# Patient Record
Sex: Male | Born: 1960 | Race: White | Hispanic: No | Marital: Single | Smoking: Former smoker
Health system: Southern US, Community
[De-identification: ages and names within clinical notes are randomized; demographics above are authoritative.]

## PROBLEM LIST (undated history)

## (undated) DIAGNOSIS — E785 Hyperlipidemia, unspecified: Secondary | ICD-10-CM

## (undated) DIAGNOSIS — R011 Cardiac murmur, unspecified: Secondary | ICD-10-CM

## (undated) HISTORY — PX: COLONOSCOPY: SHX174

---

## 2007-10-06 ENCOUNTER — Encounter: Admission: RE | Admit: 2007-10-06 | Discharge: 2007-10-06 | Payer: Self-pay | Admitting: Neurological Surgery

## 2007-10-31 ENCOUNTER — Ambulatory Visit: Payer: Self-pay | Admitting: Neurological Surgery

## 2007-11-21 ENCOUNTER — Ambulatory Visit: Payer: Self-pay | Admitting: Neurological Surgery

## 2008-01-02 ENCOUNTER — Ambulatory Visit: Payer: Self-pay | Admitting: Neurological Surgery

## 2010-03-07 IMAGING — CR CERVICAL SPINE - 2-3 VIEW
1 series · 6 of 6 positions shown · non-contrast
Comparison: none

REASON FOR EXAM: neck pain send films with pt
COMMENTS:

[Series 1: view not recorded · 0.17mm/px · 6 of 6 slices shown]
[im 1/6]
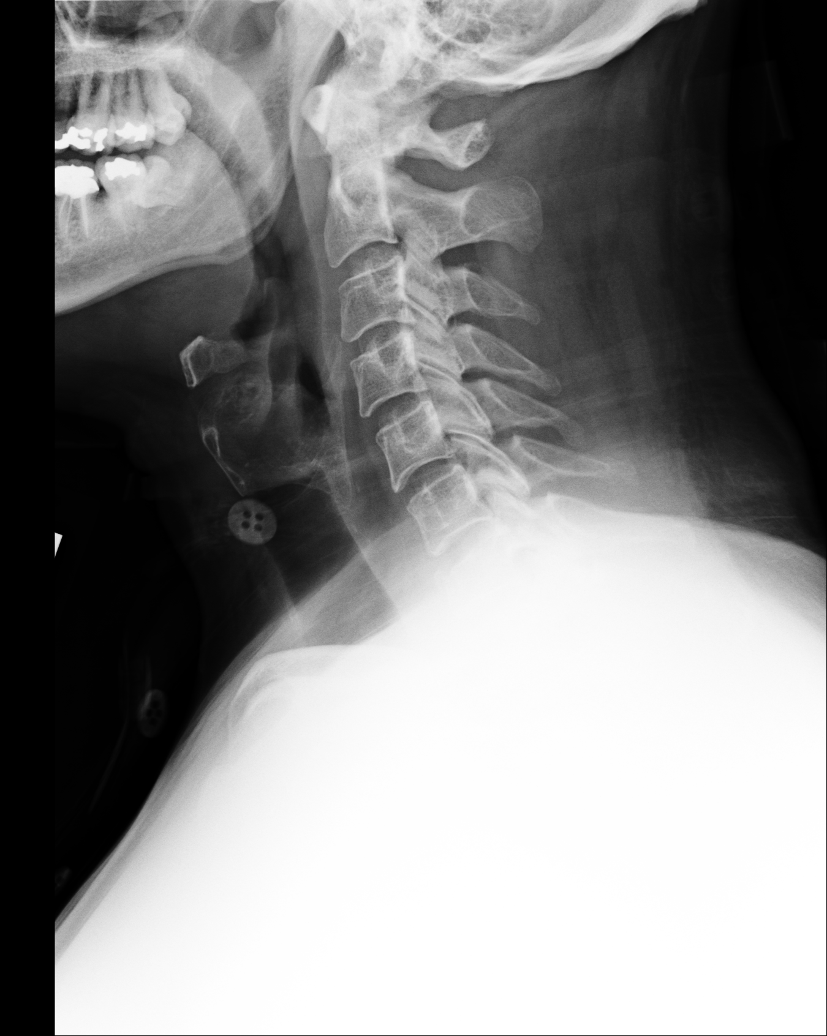
[im 2/6]
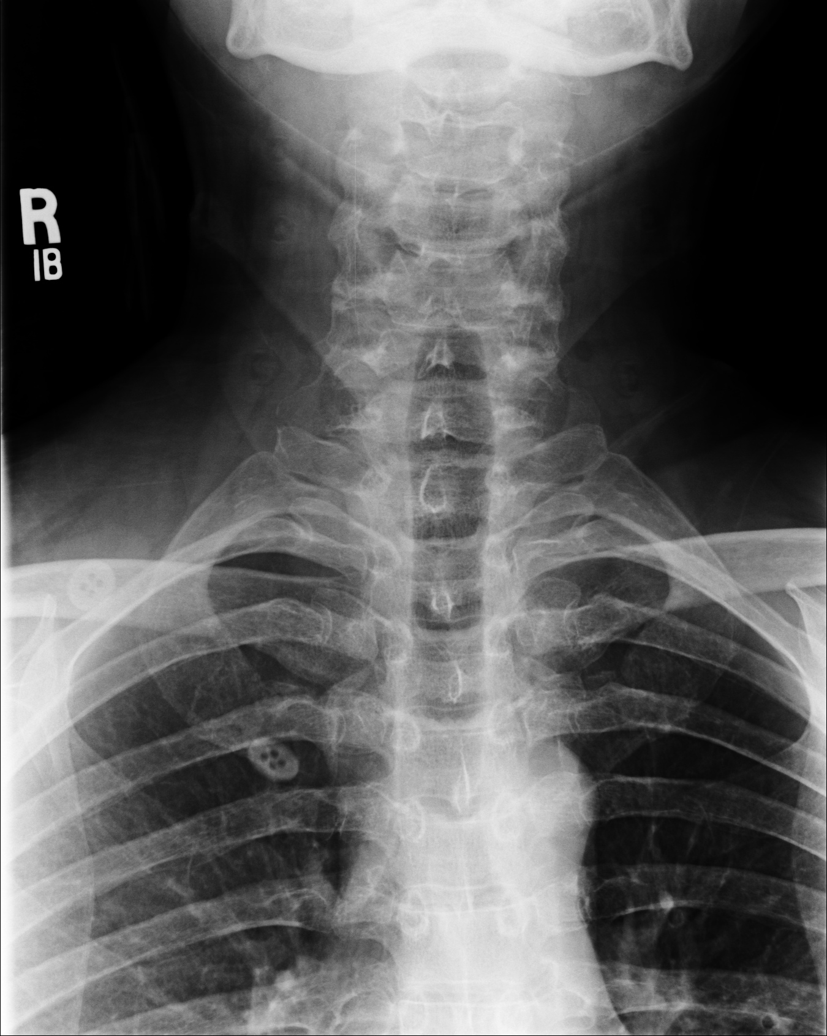
[im 3/6]
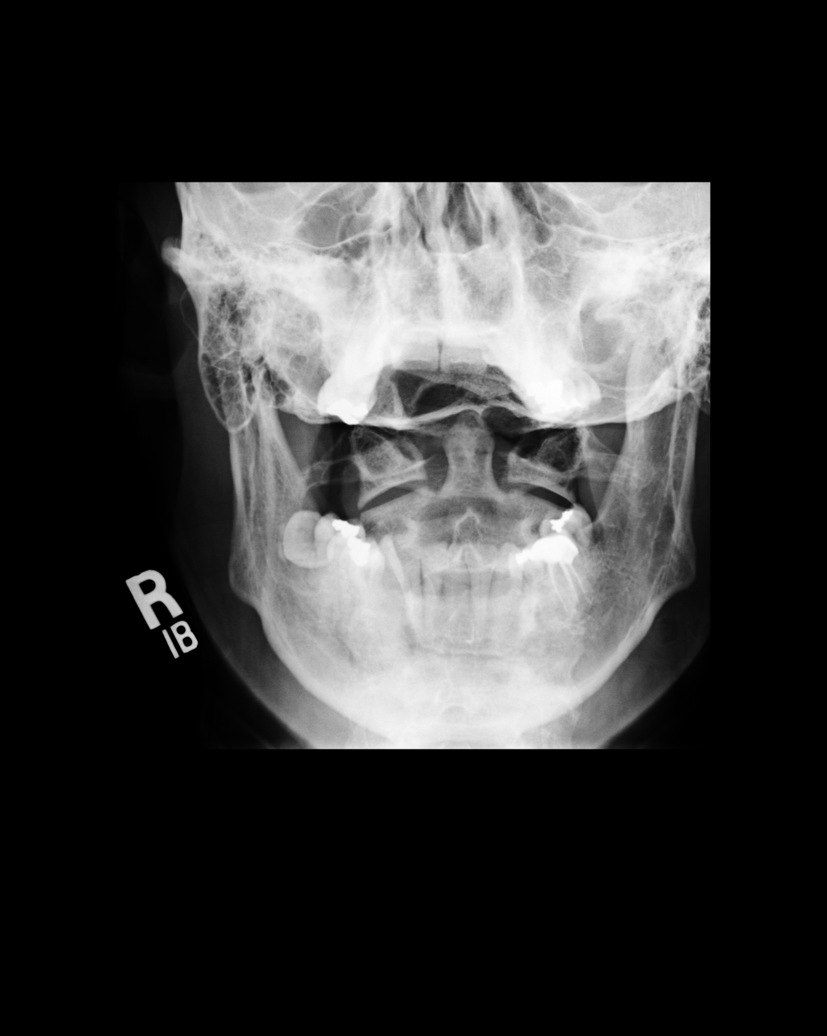
[im 4/6]
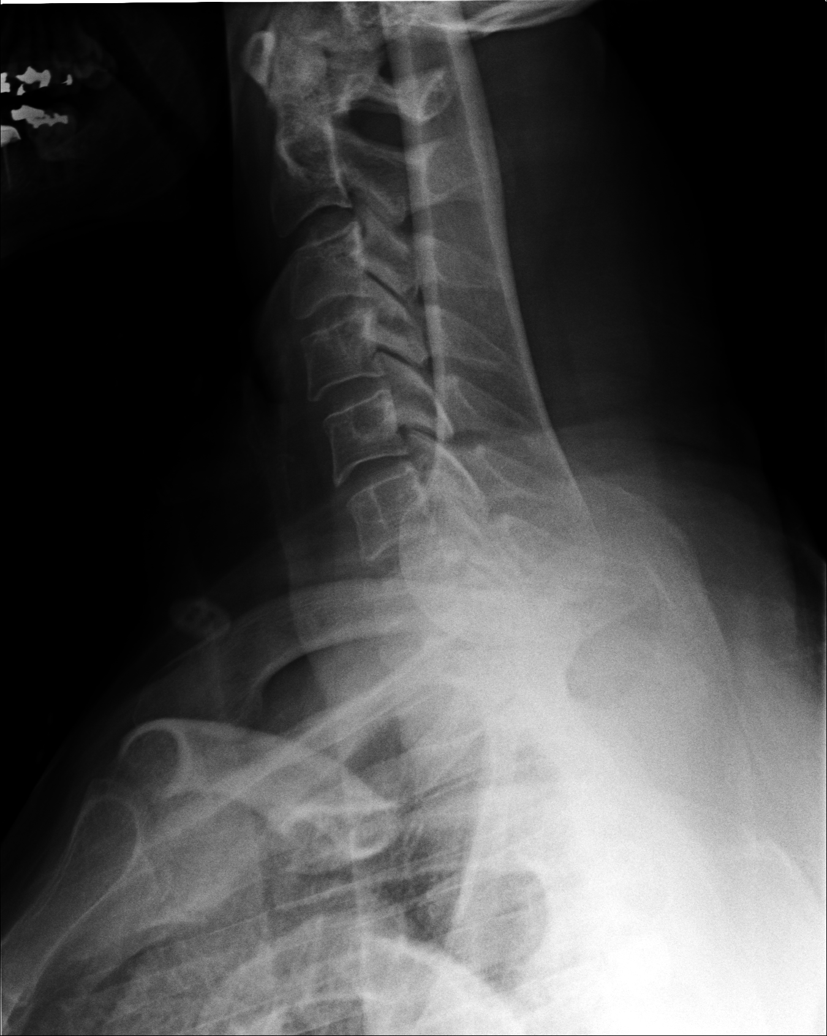
[im 5/6]
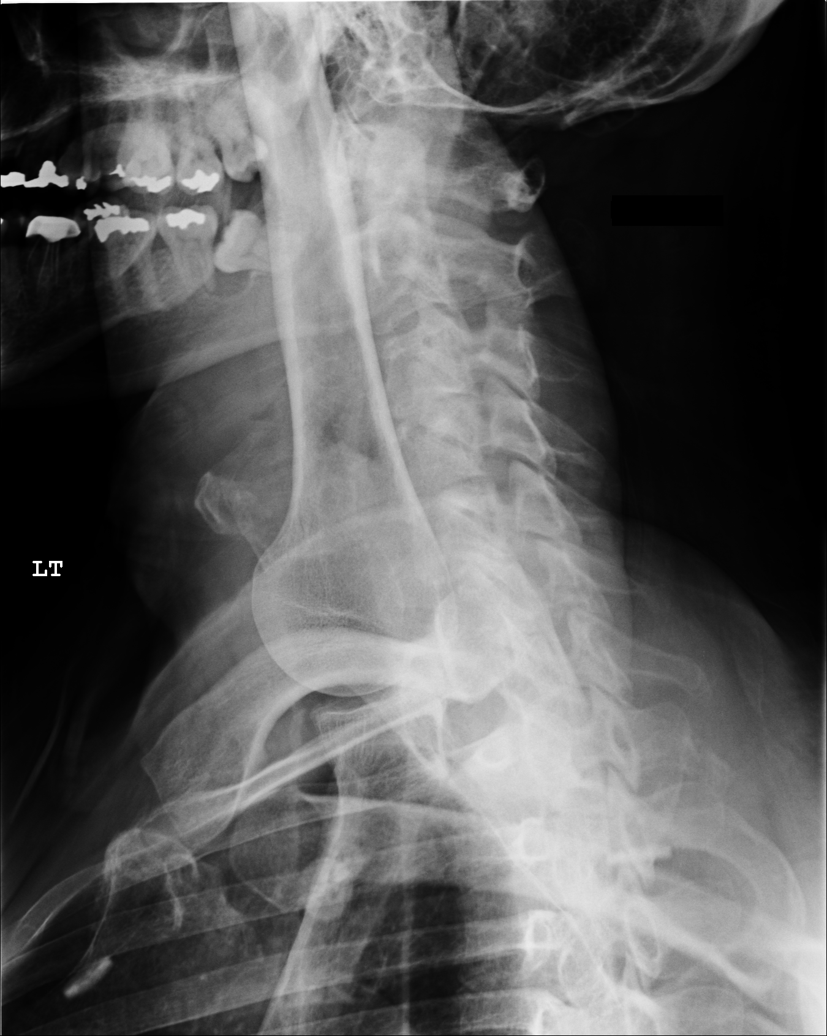
[im 6/6]
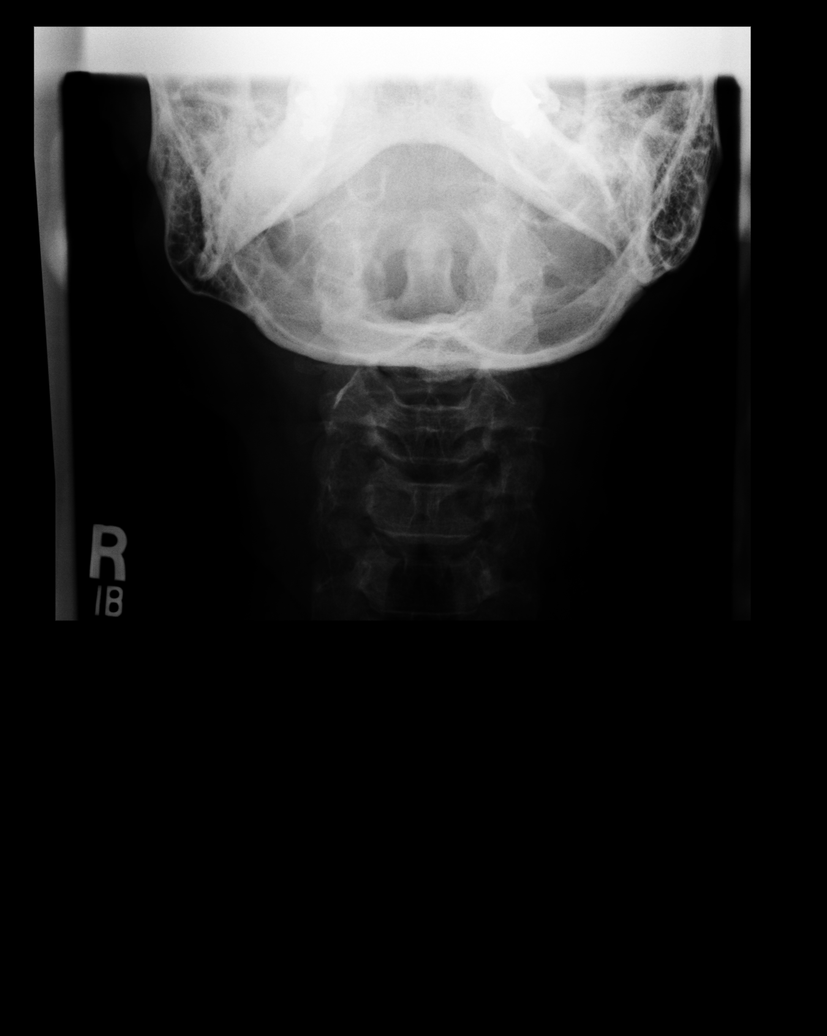

[6 of 6 positions shown; findings below may reference images not displayed]

PROCEDURE:     DXR - DXR C- SPINE AP AND LATERAL  - October 31, 2007 [DATE]

RESULT:     The patient reportedly has as a fracture of C7. AP, lateral and
odontoid views of the cervical spine were obtained. C7 is obscured on the
routine lateral views but is seen on a swimmer's view lateral. There is
deformity of the superior margin of C7 compatible with the clinical history
of C7 fracture. There is approximately 20% loss of vertebral body height
anteriorly. There may be irregularity of the anterior/superior margin of T1
as well but this is not definite.

The vertebral body heights and the intervertebral disc spaces otherwise are
well maintained. The odontoid process is intact. The vertebral body
alignment is normal.
IMPRESSION: 1. There is noted deformity of the anterior aspect of C7 consistent with the
patient's history of prior C7 fracture.
2. Possible irregularity of the anterior/superior margin of T1.
3. Vertebral body alignment remains normal.

## 2019-05-31 ENCOUNTER — Ambulatory Visit: Payer: BC Managed Care – PPO | Attending: Internal Medicine

## 2019-05-31 DIAGNOSIS — Z23 Encounter for immunization: Secondary | ICD-10-CM

## 2019-05-31 NOTE — Progress Notes (Signed)
   Covid-19 Vaccination Clinic  Name:  DAUD CAYER    MRN: [id number] DOB: 1960/11/29  05/31/2019  Mr. Kauth was observed post Covid-19 immunization for 15 minutes without incident. He was provided with Vaccine Information Sheet and instruction to access the V-Safe system.   Mr. Closser was instructed to call 911 with any severe reactions post vaccine: Marland Kitchen Difficulty breathing  . Swelling of face and throat  . A fast heartbeat  . A bad rash all over body  . Dizziness and weakness   Immunizations Administered    Name Date Dose VIS Date Route   Pfizer COVID-19 Vaccine 05/31/2019  5:48 PM 0.3 mL 02/13/2019 Intramuscular   Manufacturer: [employer], [employer]   Lot: [id number]   NDC: [address]-3

## 2019-06-21 ENCOUNTER — Ambulatory Visit: Payer: BC Managed Care – PPO | Attending: Internal Medicine

## 2019-06-21 DIAGNOSIS — Z23 Encounter for immunization: Secondary | ICD-10-CM

## 2019-06-21 NOTE — Progress Notes (Signed)
   Covid-19 Vaccination Clinic  Name:  MAXUM CASSARINO    MRN: [id number] DOB: Mar 07, 1960  06/21/2019  Mr. Okray was observed post Covid-19 immunization for 15 minutes without incident. He was provided with Vaccine Information Sheet and instruction to access the V-Safe system.   Mr. Ohalloran was instructed to call 911 with any severe reactions post vaccine: Marland Kitchen Difficulty breathing  . Swelling of face and throat  . A fast heartbeat  . A bad rash all over body  . Dizziness and weakness   Immunizations Administered    Name Date Dose VIS Date Route   Pfizer COVID-19 Vaccine 06/21/2019  5:41 PM 0.3 mL 04/29/2018 Intramuscular   Manufacturer: [employer], [employer]   Lot: [id number]   NDC: [address]-6

## 2021-03-05 HISTORY — PX: DENTAL SURGERY: SHX609

## 2021-10-24 ENCOUNTER — Encounter: Payer: Self-pay | Admitting: Ophthalmology

## 2021-10-25 ENCOUNTER — Encounter: Payer: Self-pay | Admitting: Ophthalmology

## 2021-10-27 DIAGNOSIS — I10 Essential (primary) hypertension: Secondary | ICD-10-CM | POA: Insufficient documentation

## 2021-10-30 NOTE — Discharge Instructions (Signed)

## 2021-10-31 ENCOUNTER — Ambulatory Visit: Payer: 59 | Admitting: Anesthesiology

## 2021-10-31 ENCOUNTER — Encounter
Admission: RE | Disposition: A | Discharge status: Home / Self Care | Payer: Self-pay | Source: Home / Self Care | Attending: Ophthalmology

## 2021-10-31 ENCOUNTER — Other Ambulatory Visit: Payer: Self-pay

## 2021-10-31 ENCOUNTER — Ambulatory Visit
Admission: RE | Admit: 2021-10-31 | Discharge: 2021-10-31 | Disposition: A | Discharge status: Home / Self Care | Payer: 59 | Attending: Ophthalmology | Admitting: Ophthalmology

## 2021-10-31 ENCOUNTER — Ambulatory Visit (AMBULATORY_SURGERY_CENTER): Payer: 59 | Admitting: Anesthesiology

## 2021-10-31 DIAGNOSIS — I10 Essential (primary) hypertension: Secondary | ICD-10-CM

## 2021-10-31 DIAGNOSIS — E119 Type 2 diabetes mellitus without complications: Secondary | ICD-10-CM

## 2021-10-31 DIAGNOSIS — H2512 Age-related nuclear cataract, left eye: Secondary | ICD-10-CM | POA: Diagnosis not present

## 2021-10-31 DIAGNOSIS — Z87891 Personal history of nicotine dependence: Secondary | ICD-10-CM | POA: Diagnosis not present

## 2021-10-31 HISTORY — PX: CATARACT EXTRACTION W/PHACO: SHX586

## 2021-10-31 HISTORY — DX: Hyperlipidemia, unspecified: E78.5

## 2021-10-31 HISTORY — DX: Cardiac murmur, unspecified: R01.1

## 2021-10-31 SURGERY — PHACOEMULSIFICATION, CATARACT, WITH IOL INSERTION
Anesthesia: Monitor Anesthesia Care | Site: Eye | Laterality: Left

## 2021-10-31 MED ORDER — MOXIFLOXACIN HCL 0.5 % OP SOLN
OPHTHALMIC | Status: DC | PRN
  Administered 2021-10-31: 0.2 mL via OPHTHALMIC

## 2021-10-31 MED ORDER — ARMC OPHTHALMIC DILATING DROPS
1.0000 | OPHTHALMIC | Status: DC | PRN
  Administered 2021-10-31 (×3): 1 via OPHTHALMIC

## 2021-10-31 MED ORDER — MIDAZOLAM HCL 2 MG/2ML IJ SOLN
INTRAMUSCULAR | Status: DC | PRN
  Administered 2021-10-31 (×2): 1 mg via INTRAVENOUS

## 2021-10-31 MED ORDER — TETRACAINE HCL 0.5 % OP SOLN
1.0000 [drp] | OPHTHALMIC | Status: DC | PRN
  Administered 2021-10-31 (×2): 1 [drp] via OPHTHALMIC

## 2021-10-31 MED ORDER — SIGHTPATH DOSE#1 NA CHONDROIT SULF-NA HYALURON 40-17 MG/ML IO SOLN
INTRAOCULAR | Status: DC | PRN
  Administered 2021-10-31: 1 mL via INTRAOCULAR

## 2021-10-31 MED ORDER — BRIMONIDINE TARTRATE-TIMOLOL 0.2-0.5 % OP SOLN
OPHTHALMIC | Status: DC | PRN
  Administered 2021-10-31: 1 [drp] via OPHTHALMIC

## 2021-10-31 MED ORDER — SIGHTPATH DOSE#1 BSS IO SOLN
INTRAOCULAR | Status: DC | PRN
  Administered 2021-10-31: 15 mL via INTRAOCULAR

## 2021-10-31 MED ORDER — FENTANYL CITRATE (PF) 100 MCG/2ML IJ SOLN
INTRAMUSCULAR | Status: DC | PRN
  Administered 2021-10-31: 100 ug via INTRAVENOUS

## 2021-10-31 MED ORDER — LACTATED RINGERS IV SOLN: INTRAVENOUS | Status: DC

## 2021-10-31 MED ORDER — FENTANYL CITRATE PF 50 MCG/ML IJ SOSY: 25.0000 ug | PREFILLED_SYRINGE | INTRAMUSCULAR | Status: DC | PRN

## 2021-10-31 MED ORDER — SIGHTPATH DOSE#1 BSS IO SOLN
INTRAOCULAR | Status: DC | PRN
  Administered 2021-10-31: 66 mL via OPHTHALMIC

## 2021-10-31 MED ORDER — SIGHTPATH DOSE#1 BSS IO SOLN
INTRAOCULAR | Status: DC | PRN
  Administered 2021-10-31: 2 mL

## 2021-10-31 MED ORDER — ONDANSETRON HCL 4 MG/2ML IJ SOLN: 4.0000 mg | Freq: Once | INTRAMUSCULAR | Status: DC | PRN

## 2021-10-31 SURGICAL SUPPLY — 12 items
CANNULA ANT/CHMB 27G (MISCELLANEOUS) IMPLANT
CANNULA ANT/CHMB 27GA (MISCELLANEOUS) IMPLANT
CATARACT SUITE SIGHTPATH (MISCELLANEOUS) ×1 IMPLANT
FEE CATARACT SUITE SIGHTPATH (MISCELLANEOUS) ×1 IMPLANT
GLOVE SURG ENC TEXT LTX SZ8 (GLOVE) ×1 IMPLANT
GLOVE SURG TRIUMPH 8.0 PF LTX (GLOVE) ×1 IMPLANT
LENS IOL TECNIS EYHANCE 18.5 (Intraocular Lens) IMPLANT
NDL FILTER BLUNT 18X1 1/2 (NEEDLE) ×1 IMPLANT
NEEDLE FILTER BLUNT 18X 1/2SAF (NEEDLE) ×1
NEEDLE FILTER BLUNT 18X1 1/2 (NEEDLE) ×1 IMPLANT
SYR 3ML LL SCALE MARK (SYRINGE) ×1 IMPLANT
WATER STERILE IRR 250ML POUR (IV SOLUTION) ×1 IMPLANT

## 2021-10-31 NOTE — H&P (Signed)
[facility]   Primary Care Physician:  Marisue Ivan, MD Ophthalmologist: Dr. Druscilla Brownie  Pre-Procedure History & Physical: HPI:  Louis Barron is a 61 y.o. male here for cataract surgery.   Past Medical History:  Diagnosis Date   Heart murmur    as a child   Hyperlipidemia     Past Surgical History:  Procedure Laterality Date   COLONOSCOPY     DENTAL SURGERY  2023    Prior to Admission medications   Medication Sig Start Date End Date Taking? Authorizing Provider  chlorthalidone (HYGROTON) 25 MG tablet Take 25 mg by mouth daily.   Yes [provider]  simvastatin (ZOCOR) 40 MG tablet Take 40 mg by mouth daily.   Yes [provider]  venlafaxine XR (EFFEXOR-XR) 75 MG 24 hr capsule Take 150 mg by mouth at bedtime.   Yes [provider]    Allergies as of 09/19/2021   (Not on File)    History reviewed. No pertinent family history.  Social History   Socioeconomic History   Marital status: Single    Spouse name: Not on file   Number of children: Not on file   Years of education: Not on file   Highest education level: Not on file  Occupational History   Not on file  Tobacco Use   Smoking status: Former    Types: Cigarettes    Quit date: 45    Years since quitting: 40.6   Smokeless tobacco: Never   Tobacco comments:    Smoked while in the [employer] Use: Never used  Substance and Sexual Activity   Alcohol use: Yes    Alcohol/week: 12.0 standard drinks of alcohol    Types: 12 Cans of beer per week   Drug use: Not on file   Sexual activity: Not on file  Other Topics Concern   Not on file  Social History Narrative   Not on file   Social Determinants of Health   Financial Resource Strain: Not on file  Food Insecurity: Not on file  Transportation Needs: Not on file  Physical Activity: Not on file  Stress: Not on file  Social Connections: Not on file  Intimate Partner Violence: Not on file    Review  of Systems: See HPI, otherwise negative ROS  Physical Exam: BP (!) 144/94   Pulse 84   Temp (!) 97.5 F (36.4 C) (Temporal)   Resp 18   Ht 5\' 9"  (1.753 m)   Wt 109.8 kg   SpO2 95%   BMI 35.74 kg/m  General:   Alert, cooperative in NAD Head:  Normocephalic and atraumatic. Respiratory:  Normal work of breathing. Cardiovascular:  RRR  Impression/Plan: Louis Barron is here for cataract surgery.  Risks, benefits, limitations, and alternatives regarding cataract surgery have been reviewed with the patient.  Questions have been answered.  All parties agreeable.   Victorino December, MD  10/31/2021, 11:26 AM

## 2021-10-31 NOTE — Anesthesia Preprocedure Evaluation (Signed)
Anesthesia Evaluation  Patient identified by MRN, date of birth, ID band Patient awake    Reviewed: Allergy & Precautions, H&P , NPO status , Patient's Chart, lab work & pertinent test results, reviewed documented beta blocker date and time   Airway Mallampati: II  TM Distance: >3 FB Neck ROM: full    Dental no notable dental hx. (+) Teeth Intact   Pulmonary neg pulmonary ROS, former smoker,    Pulmonary exam normal breath sounds clear to auscultation       Cardiovascular Exercise Tolerance: Good hypertension, On Medications + Valvular Problems/Murmurs  Rhythm:regular Rate:Normal     Neuro/Psych negative neurological ROS  negative psych ROS   GI/Hepatic negative GI ROS, Neg liver ROS,   Endo/Other  negative endocrine ROSdiabetes, Well Controlled  Renal/GU      Musculoskeletal   Abdominal   Peds  Hematology negative hematology ROS (+)   Anesthesia Other Findings   Reproductive/Obstetrics negative OB ROS                             Anesthesia Physical Anesthesia Plan  ASA: 2  Anesthesia Plan: MAC   Post-op Pain Management:    Induction:   PONV Risk Score and Plan: 1  Airway Management Planned:   Additional Equipment:   Intra-op Plan:   Post-operative Plan:   Informed Consent: I have reviewed the patients History and Physical, chart, labs and discussed the procedure including the risks, benefits and alternatives for the proposed anesthesia with the patient or authorized representative who has indicated his/her understanding and acceptance.       Plan Discussed with: CRNA  Anesthesia Plan Comments:         Anesthesia Quick Evaluation

## 2021-10-31 NOTE — Anesthesia Postprocedure Evaluation (Signed)
Anesthesia Post Note  Patient: Louis Barron  Procedure(s) Performed: CATARACT EXTRACTION PHACO AND INTRAOCULAR LENS PLACEMENT (IOC) LEFT 7.60 00:44.7 (Left: Eye)     Patient location during evaluation: PACU Anesthesia Type: MAC Level of consciousness: awake and alert Pain management: pain level controlled Vital Signs Assessment: post-procedure vital signs reviewed and stable Respiratory status: spontaneous breathing, nonlabored ventilation, respiratory function stable and patient connected to nasal cannula oxygen Cardiovascular status: stable and blood pressure returned to baseline Postop Assessment: no apparent nausea or vomiting Anesthetic complications: no   There were no known notable events for this encounter.  [name]

## 2021-10-31 NOTE — Transfer of Care (Signed)
Immediate Anesthesia Transfer of Care Note  Patient: Louis Barron  Procedure(s) Performed: CATARACT EXTRACTION PHACO AND INTRAOCULAR LENS PLACEMENT (IOC) LEFT 7.60 00:44.7 (Left: Eye)  Patient Location: PACU  Anesthesia Type: MAC  Level of Consciousness: awake, alert  and patient cooperative  Airway and Oxygen Therapy: Patient Spontanous Breathing and Patient connected to supplemental oxygen  Post-op Assessment: Post-op Vital signs reviewed, Patient's Cardiovascular Status Stable, Respiratory Function Stable, Patent Airway and No signs of Nausea or vomiting  Post-op Vital Signs: Reviewed and stable  Complications: There were no known notable events for this encounter.

## 2021-10-31 NOTE — Op Note (Signed)
PREOPERATIVE DIAGNOSIS:  Nuclear sclerotic cataract of the left eye.   POSTOPERATIVE DIAGNOSIS:  Nuclear sclerotic cataract of the left eye.   OPERATIVE PROCEDURE:ORPROCALL@   SURGEON:  Galen Manila, MD.   ANESTHESIA:  Anesthesiologist: Yevette Edwards, MD CRNA: Sharyn Dross, CRNA  1.      Managed anesthesia care. 2.     0.45ml of Shugarcaine was instilled following the paracentesis   COMPLICATIONS:  None.   TECHNIQUE:   Stop and chop   DESCRIPTION OF PROCEDURE:  The patient was examined and consented in the preoperative holding area where the aforementioned topical anesthesia was applied to the left eye and then brought back to the Operating Room where the left eye was prepped and draped in the usual sterile ophthalmic fashion and a lid speculum was placed. A paracentesis was created with the side port blade and the anterior chamber was filled with viscoelastic. A near clear corneal incision was performed with the steel keratome. A continuous curvilinear capsulorrhexis was performed with a cystotome followed by the capsulorrhexis forceps. Hydrodissection and hydrodelineation were carried out with BSS on a blunt cannula. The lens was removed in a stop and chop  technique and the remaining cortical material was removed with the irrigation-aspiration handpiece. The capsular bag was inflated with viscoelastic and the Technis [id number] lens was placed in the capsular bag without complication. The remaining viscoelastic was removed from the eye with the irrigation-aspiration handpiece. The wounds were hydrated. The anterior chamber was flushed with BSS and the eye was inflated to physiologic pressure. 0.84ml Vigamox was placed in the anterior chamber. The wounds were found to be water tight. The eye was dressed with Combigan. The patient was given protective glasses to wear throughout the day and a shield with which to sleep tonight. The patient was also given drops with which to begin a drop regimen  today and will follow-up with me in one day. Implant Name Type Inv. Item Serial No. Manufacturer Lot No. LRB No. Used Action  LENS IOL TECNIS EYHANCE 18.5 - [id number] Intraocular Lens LENS IOL TECNIS EYHANCE 18.5 [id number] [employer]  Left 1 Implanted    Procedure(s) with comments: CATARACT EXTRACTION PHACO AND INTRAOCULAR LENS PLACEMENT (IOC) LEFT 7.60 00:44.7 (Left) - Requests early  Electronically signed: Galen Manila 10/31/2021 11:54 AM

## 2021-11-01 ENCOUNTER — Encounter: Payer: Self-pay | Admitting: Ophthalmology

## 2022-01-26 DIAGNOSIS — R0602 Shortness of breath: Secondary | ICD-10-CM | POA: Diagnosis present

## 2022-01-26 DIAGNOSIS — Z1152 Encounter for screening for COVID-19: Secondary | ICD-10-CM | POA: Diagnosis not present

## 2022-01-26 DIAGNOSIS — R062 Wheezing: Secondary | ICD-10-CM | POA: Diagnosis not present

## 2022-01-26 DIAGNOSIS — R059 Cough, unspecified: Secondary | ICD-10-CM | POA: Diagnosis not present

## 2022-01-26 DIAGNOSIS — E871 Hypo-osmolality and hyponatremia: Secondary | ICD-10-CM | POA: Diagnosis not present

## 2022-01-26 DIAGNOSIS — Z87891 Personal history of nicotine dependence: Secondary | ICD-10-CM | POA: Diagnosis not present

## 2022-01-26 NOTE — ED Triage Notes (Signed)
First Nurse Note: Pt reports seen at [facility] in [place] for c/o sob for one month. They did CXR and dx w/acute cough. Pt reports they advised for him to come to the ER if not better. Pt reports chest rightness and wheezing.

## 2022-01-27 ENCOUNTER — Other Ambulatory Visit: Payer: Self-pay

## 2022-01-27 ENCOUNTER — Emergency Department: Payer: 59

## 2022-01-27 ENCOUNTER — Emergency Department
Admission: EM | Admit: 2022-01-27 | Discharge: 2022-01-27 | Disposition: A | Discharge status: Home / Self Care | Payer: 59 | Attending: Emergency Medicine | Admitting: Emergency Medicine

## 2022-01-27 DIAGNOSIS — R062 Wheezing: Secondary | ICD-10-CM

## 2022-01-27 DIAGNOSIS — E871 Hypo-osmolality and hyponatremia: Secondary | ICD-10-CM

## 2022-01-27 DIAGNOSIS — R051 Acute cough: Secondary | ICD-10-CM

## 2022-01-27 LAB — CBC
HCT: 43.8 % (ref 39.0–52.0)
Hemoglobin: 15.6 g/dL (ref 13.0–17.0)
MCH: 30.3 pg (ref 26.0–34.0)
MCHC: 35.6 g/dL (ref 30.0–36.0)
MCV: 85 fL (ref 80.0–100.0)
Platelets: 343 10*3/uL (ref 150–400)
RBC: 5.15 MIL/uL (ref 4.22–5.81)
RDW: 12.3 % (ref 11.5–15.5)
WBC: 11.1 10*3/uL — ABNORMAL HIGH (ref 4.0–10.5)
nRBC: 0 % (ref 0.0–0.2)

## 2022-01-27 LAB — BASIC METABOLIC PANEL
Anion gap: 11 (ref 5–15)
Anion gap: 9 (ref 5–15)
BUN: 15 mg/dL (ref 8–23)
BUN: 17 mg/dL (ref 8–23)
CO2: 23 mmol/L (ref 22–32)
CO2: 24 mmol/L (ref 22–32)
Calcium: 8.3 mg/dL — ABNORMAL LOW (ref 8.9–10.3)
Calcium: 8.4 mg/dL — ABNORMAL LOW (ref 8.9–10.3)
Chloride: 91 mmol/L — ABNORMAL LOW (ref 98–111)
Chloride: 94 mmol/L — ABNORMAL LOW (ref 98–111)
Creatinine, Ser: 0.77 mg/dL (ref 0.61–1.24)
Creatinine, Ser: 0.92 mg/dL (ref 0.61–1.24)
GFR, Estimated: >60 mL/min (ref >60)
GFR, Estimated: >60 mL/min (ref >60)
Glucose, Bld: 108 mg/dL — ABNORMAL HIGH (ref 70–99)
Glucose, Bld: 109 mg/dL — ABNORMAL HIGH (ref 70–99)
Potassium: 3.3 mmol/L — ABNORMAL LOW (ref 3.5–5.1)
Potassium: 3.5 mmol/L (ref 3.5–5.1)
Sodium: 123 mmol/L — ABNORMAL LOW (ref 135–145)
Sodium: 129 mmol/L — ABNORMAL LOW (ref 135–145)

## 2022-01-27 LAB — RESP PANEL BY RT-PCR (FLU A&B, COVID) ARPGX2
Influenza A by PCR: NEGATIVE
Influenza B by PCR: NEGATIVE
SARS Coronavirus 2 by RT PCR: NEGATIVE

## 2022-01-27 LAB — TROPONIN I (HIGH SENSITIVITY)
Troponin I (High Sensitivity): 5 ng/L (ref <18)
Troponin I (High Sensitivity): 6 ng/L (ref <18)

## 2022-01-27 MED ORDER — IOHEXOL 350 MG/ML SOLN
80.0000 mL | Freq: Once | INTRAVENOUS | Status: AC | PRN
  Administered 2022-01-27: 80 mL via INTRAVENOUS

## 2022-01-27 MED ORDER — AZITHROMYCIN 250 MG PO TABS: ORAL_TABLET | ORAL | 0 refills | Status: DC

## 2022-01-27 MED ORDER — OMEPRAZOLE MAGNESIUM 20 MG PO TBEC: 20.0000 mg | DELAYED_RELEASE_TABLET | Freq: Every day | ORAL | 1 refills | Status: AC

## 2022-01-27 MED ORDER — IPRATROPIUM-ALBUTEROL 0.5-2.5 (3) MG/3ML IN SOLN
3.0000 mL | Freq: Once | RESPIRATORY_TRACT | Status: AC
  Administered 2022-01-27: 3 mL via RESPIRATORY_TRACT
  Filled 2022-01-27: qty 3

## 2022-01-27 MED ORDER — SODIUM CHLORIDE 0.9 % IV BOLUS
1000.0000 mL | Freq: Once | INTRAVENOUS | Status: AC
  Administered 2022-01-27: 1000 mL via INTRAVENOUS

## 2022-01-27 NOTE — ED Notes (Signed)
Nt Hydrographic surveyor) recommend pt to lay in bed. Pt refused and stated due to abd pain he's comfortable sitting in wheelchair.

## 2022-01-27 NOTE — ED Notes (Signed)
PT REQUEST TO BE RE-EVAULATED, PT REPORTS INCREASE SOB AND CP, PT CRYING OUT IN PAIN IN WR LOBBY, PT TAKEN BACK TO TRIAGE FOR VITALS RECHECK. CHARGE RN NOTIFIED

## 2022-01-27 NOTE — ED Provider Notes (Signed)
[facility] Provider Note    Event Date/Time   First MD Initiated Contact with Patient 01/27/22 [phone]     (approximate)   History   Shortness of Breath   HPI  Louis Barron is a 62 y.o. male who presents for evaluation of shortness of breath and some chest pain.  He said that he has been struggling with shortness of breath for about 6 weeks.  He feels tight and wheezy at times.  He went to an urgent care a while ago and had a chest x-ray that was normal and he was described just with a cough.  He said that since then he has had worsening chest tightness and some pain that radiates through his ribs on both sides.  He is not certain if this is a new problem, or if it is because he has been coughing for so long.  He said it is making him scared that something worse is happening and he has a history of anxiety.  He has no known cardiac history.  He has no diagnosis of sleep apnea but said that sometimes when he wakes up at night he is gasping for breath.  He has only a very distant smoking history and no diagnosis of COPD or other lung disease.  No recent fevers.  No nausea, vomiting, nor abdominal pain.  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     Physical Exam   Triage Vital Signs: ED Triage Vitals  Enc Vitals Group     BP 01/27/22 0010 (!) 170/105     Pulse Rate 01/27/22 0010 81     Resp 01/27/22 0010 15     Temp 01/27/22 0010 97.8 F (36.6 C)     Temp Source 01/27/22 0010 Oral     SpO2 01/27/22 0010 94 %     Weight 01/27/22 0011 108.9 kg (240 lb)     Height 01/27/22 0011 1.753 m (5\' 9" )     Head Circumference --      Peak Flow --      Pain Score 01/27/22 0011 9     Pain Loc --      Pain Edu? --      Excl. in GC? --     Most recent vital signs: Vitals:   01/27/22 0500 01/27/22 0630  BP: (!) 172/100 (!) 159/97  Pulse: 72 67  Resp: (!) 23 (!) 23  Temp:    SpO2: 92%  93%     General: Awake, alert, oriented. CV:  Good peripheral perfusion.  Regular rate and rhythm, normal heart sounds. Resp:  The patient has some tachypnea but no audible wheezing.  Clear breath sounds throughout both sides although this seems somewhat diminished compared to what I would anticipate. Abd:  No distention.  No tenderness to palpation. Other:  Patient is obviously quite anxious but understandable under the circumstances.   ED Results / Procedures / Treatments   Labs (all labs ordered are listed, but only abnormal results are displayed) Labs Reviewed  BASIC METABOLIC PANEL - Abnormal; Notable for the following components:      Result Value   Sodium 123 (*)    Chloride 91 (*)    Glucose, Bld 108 (*)    Calcium 8.4 (*)    All other components within normal limits  CBC - Abnormal; Notable for the following components:  WBC 11.1 (*)    All other components within normal limits  RESP PANEL BY RT-PCR (FLU A&B, COVID) ARPGX2  BASIC METABOLIC PANEL  TROPONIN I (HIGH SENSITIVITY)  TROPONIN I (HIGH SENSITIVITY)     EKG  ED ECG REPORT I, Hinda Kehr, the attending physician, personally viewed and interpreted this ECG.  Date: 01/27/2022 EKG Time: 00: 12 Rate: 81 Rhythm: normal sinus rhythm QRS Axis: normal Intervals: normal ST/T Wave abnormalities: normal Narrative Interpretation: no evidence of acute ischemia    RADIOLOGY I viewed and interpreted the patient's two-view chest x-ray.  I see no evidence of pneumonia, pneumothorax, nor other acute abnormality.  I also read the radiologist's report, which confirmed no acute findings.  I also viewed and interpreted the patient's CTA chest.  See hospital course for details, some scattered groundglass opacities (infectious or inflammatory), no PE.    PROCEDURES:  Critical Care performed: No  .1-3 Lead EKG Interpretation  Performed by: Hinda Kehr, MD Authorized by: Hinda Kehr, MD     Interpretation:  normal     ECG rate:  68   ECG rate assessment: normal     Rhythm: sinus rhythm     Ectopy: none     Conduction: normal      MEDICATIONS ORDERED IN ED: Medications  ipratropium-albuterol (DUONEB) 0.5-2.5 (3) MG/3ML nebulizer solution 3 mL (3 mLs Nebulization Given 01/27/22 0025)  sodium chloride 0.9 % bolus 1,000 mL (1,000 mLs Intravenous New Bag/Given 01/27/22 0538)  iohexol (OMNIPAQUE) 350 MG/ML injection 80 mL (80 mLs Intravenous Contrast Given 01/27/22 0609)     IMPRESSION / MDM / ASSESSMENT AND PLAN / ED COURSE  I reviewed the triage vital signs and the nursing notes.                              Differential diagnosis includes, but is not limited to, subacute/chronic bronchitis, new diagnosis of lung disease such as COPD, new onset CHF, sleep apnea, ACS, PE, pneumothorax.  Patient's presentation is most consistent with acute presentation with potential threat to life or bodily function.  Vital signs are generally reassuring although patient does have some tachypnea.  I think that anxiety definitely plays a role in his symptoms and I have verified in his medical record he has prior diagnosis of anxiety.  However I think that it is probably exacerbating what he is feeling and that he is having true dyspnea.  Based on his body habitus and the description of waking up gasping for breath, I suspect that sleep apnea also plays a role but it is likely not the primary cause.  Labs/studies ordered: EKG, cardiac monitoring, two-view chest x-ray, BMP, respiratory viral panel, high-sensitivity troponin x2, CBC.  The patient is on the cardiac monitor to evaluate for evidence of arrhythmia and/or significant heart rate changes.  Although the patient is relatively low risk for PE, that would be the major potentially life-threatening issue that needs to be ruled out.  Given his worsening shortness of breath over time, generally clear lung sounds, and now occasional chest pain, I ordered a CTA  chest to rule out pulmonary embolism as well as to get a better look at his lung parenchyma for possible interstitial lung disease or pneumonia.  His labs are all generally reassuring except that he has a somewhat surprisingly low sodium at 123.  This could be lab error because he does not appear to be profoundly dehydrated and he does not  seem to be having symptoms of hyponatremia such as generalized weakness.  It is worth rechecking a metabolic panel after completion of a 1 L normal saline fluid bolus that I ordered.  Disposition will be based on the results of his imaging and overall respiratory status.  He had one episode where it was documented at 89% on room air but it is unclear why this happened once and has not been persistent.  We will likely ambulate the patient if no acute abnormalities are found on CTA to see if he desats with ambulation.   Clinical Course as of 01/27/22 0804  Sat Jan 27, 2022  0802 CT Angio Chest PE W/Cm &/Or Wo Cm I viewed and interpreted the patient's CTA chest and could see no evidence of pulmonary embolism.  There are a few scattered areas in the lungs that the radiologist described as groundglass opacities that could be infectious or inflammatory.  I went over all the results with the patient.  He remains very anxious about his symptoms but I reminded him, as told him by the urgent care, that acid reflux may be contributing substantially to his symptoms.  He is not taking a PPI so I encouraged him to do so.  I also talked him about raising the head of his bed up for sleeping on pillows to try to decrease the probability of aspiration.  I am writing him a course of azithromycin and Prilosec and encouraged him to follow-up with his primary care doctor.  Given his initial very low sodium level, now that he has had fluid resuscitation in the form of 1 L normal saline IV bolus, I am ordering a repeat BMP.  I transferred ED care to Dr. Jacelyn Grip, who will follow-up on the  sodium level, and if improved, will discharge the patient as per my instructions and follow-up recommendations.  The patient understands and agrees with the plan. [CF]    Clinical Course User Index [CF] Hinda Kehr, MD     FINAL CLINICAL IMPRESSION(S) / ED DIAGNOSES   Final diagnoses:  Acute cough  Hyponatremia  Wheezing     Rx / DC Orders   ED Discharge Orders          Ordered    omeprazole (PRILOSEC OTC) 20 MG tablet  Daily        01/27/22 0759    azithromycin (ZITHROMAX) 250 MG tablet        01/27/22 0759             Note:  This document was prepared using Dragon voice recognition software and may include unintentional dictation errors.   Hinda Kehr, MD 01/27/22 [phone]

## 2022-01-27 NOTE — Discharge Instructions (Signed)
As we discussed, your evaluation was generally reassuring.  You have a few areas on the CT scan of your chest that may represent inflammation or a small amount of infection, but is nothing dangerous that requires you to stay in the hospital.  We recommend you continue taking all of your regular medications, including the medications you were prescribed at the urgent care, as well as the new prescription for azithromycin which you will be able to finish in 5 days.  As we (in the urgent care) also discussed with you, it is very likely that acid reflux is playing a role in the irritation of your throat and lungs that is causing you to cough.  We recommend you try sleeping on a couple of pillows to stay elevated and take an over-the-counter proton pump inhibitor (PPI) such as Prilosec.  It is available over-the-counter but we also wrote you a prescription to help you find it at the store.  Please call the office of Dr. Burnadette Pop and schedule the next available follow-up appointment.  Of note, your sodium level was a bit low today, and that could be due to to a variety of reasons, including if you have been drinking a lot of water recently.  We recommend that you drink what is a normal amount of water for you and do not drink water to excess.  When you follow-up with your primary care provider, you may get some repeat lab work at that time to make sure your level is normal.  Return to the emergency department if you develop new or worsening symptoms that concern you.

## 2022-01-27 NOTE — ED Triage Notes (Addendum)
Pt comes from home via POV c/o sob and wheezing. Pt states he went to UC and they gave him medicine but albuterol inhaler has not been working. Now Reporting chest tightness and rib pain and SOB/wheezing getting worse. NAD at this time.

## 2022-01-27 NOTE — ED Notes (Signed)
Pt to CT

## 2022-09-11 ENCOUNTER — Other Ambulatory Visit: Payer: Self-pay | Admitting: Family Medicine

## 2022-09-11 DIAGNOSIS — Z9189 Other specified personal risk factors, not elsewhere classified: Secondary | ICD-10-CM

## 2022-09-28 ENCOUNTER — Ambulatory Visit
Admission: RE | Admit: 2022-09-28 | Discharge: 2022-09-28 | Disposition: A | Discharge status: Home / Self Care | Payer: 59 | Source: Ambulatory Visit | Attending: Family Medicine | Admitting: Family Medicine

## 2022-09-28 DIAGNOSIS — Z9189 Other specified personal risk factors, not elsewhere classified: Secondary | ICD-10-CM | POA: Insufficient documentation

## 2023-03-15 ENCOUNTER — Other Ambulatory Visit: Payer: Self-pay | Admitting: Family Medicine

## 2023-03-15 DIAGNOSIS — N644 Mastodynia: Secondary | ICD-10-CM

## 2023-03-22 ENCOUNTER — Ambulatory Visit
Admission: RE | Admit: 2023-03-22 | Discharge: 2023-03-22 | Disposition: A | Discharge status: Home / Self Care | Payer: 59 | Source: Ambulatory Visit | Attending: Family Medicine | Admitting: Family Medicine

## 2023-03-22 DIAGNOSIS — N644 Mastodynia: Secondary | ICD-10-CM

## 2023-04-22 ENCOUNTER — Other Ambulatory Visit: Payer: Self-pay

## 2023-04-22 ENCOUNTER — Emergency Department: Payer: 59

## 2023-04-22 ENCOUNTER — Observation Stay
Admission: EM | Admit: 2023-04-22 | Discharge: 2023-04-25 | Disposition: A | Discharge status: Home / Self Care | Payer: 59 | Attending: Internal Medicine | Admitting: Internal Medicine

## 2023-04-22 DIAGNOSIS — Z6838 Body mass index (BMI) 38.0-38.9, adult: Secondary | ICD-10-CM | POA: Insufficient documentation

## 2023-04-22 DIAGNOSIS — J42 Unspecified chronic bronchitis: Secondary | ICD-10-CM | POA: Diagnosis not present

## 2023-04-22 DIAGNOSIS — R7989 Other specified abnormal findings of blood chemistry: Secondary | ICD-10-CM | POA: Diagnosis not present

## 2023-04-22 DIAGNOSIS — Z1152 Encounter for screening for COVID-19: Secondary | ICD-10-CM | POA: Diagnosis not present

## 2023-04-22 DIAGNOSIS — Z87891 Personal history of nicotine dependence: Secondary | ICD-10-CM | POA: Insufficient documentation

## 2023-04-22 DIAGNOSIS — I10 Essential (primary) hypertension: Secondary | ICD-10-CM | POA: Insufficient documentation

## 2023-04-22 DIAGNOSIS — E876 Hypokalemia: Secondary | ICD-10-CM | POA: Diagnosis not present

## 2023-04-22 DIAGNOSIS — J9601 Acute respiratory failure with hypoxia: Principal | ICD-10-CM | POA: Insufficient documentation

## 2023-04-22 DIAGNOSIS — J189 Pneumonia, unspecified organism: Secondary | ICD-10-CM | POA: Insufficient documentation

## 2023-04-22 DIAGNOSIS — E669 Obesity, unspecified: Secondary | ICD-10-CM | POA: Insufficient documentation

## 2023-04-22 DIAGNOSIS — J1000 Influenza due to other identified influenza virus with unspecified type of pneumonia: Principal | ICD-10-CM | POA: Insufficient documentation

## 2023-04-22 DIAGNOSIS — Z79899 Other long term (current) drug therapy: Secondary | ICD-10-CM | POA: Diagnosis not present

## 2023-04-22 DIAGNOSIS — J101 Influenza due to other identified influenza virus with other respiratory manifestations: Secondary | ICD-10-CM | POA: Diagnosis not present

## 2023-04-22 DIAGNOSIS — E871 Hypo-osmolality and hyponatremia: Secondary | ICD-10-CM | POA: Diagnosis not present

## 2023-04-22 DIAGNOSIS — R079 Chest pain, unspecified: Secondary | ICD-10-CM | POA: Insufficient documentation

## 2023-04-22 DIAGNOSIS — R0602 Shortness of breath: Secondary | ICD-10-CM | POA: Diagnosis present

## 2023-04-22 LAB — CBC
HCT: 42.2 % (ref 39.0–52.0)
Hemoglobin: 14.8 g/dL (ref 13.0–17.0)
MCH: 30 pg (ref 26.0–34.0)
MCHC: 35.1 g/dL (ref 30.0–36.0)
MCV: 85.6 fL (ref 80.0–100.0)
Platelets: 304 10*3/uL (ref 150–400)
RBC: 4.93 MIL/uL (ref 4.22–5.81)
RDW: 13.3 % (ref 11.5–15.5)
WBC: 7.3 10*3/uL (ref 4.0–10.5)
nRBC: 0 % (ref 0.0–0.2)

## 2023-04-22 LAB — BASIC METABOLIC PANEL
Anion gap: 12 (ref 5–15)
BUN: 14 mg/dL (ref 8–23)
CO2: 24 mmol/L (ref 22–32)
Calcium: 8.3 mg/dL — ABNORMAL LOW (ref 8.9–10.3)
Chloride: 93 mmol/L — ABNORMAL LOW (ref 98–111)
Creatinine, Ser: 0.74 mg/dL (ref 0.61–1.24)
GFR, Estimated: >60 mL/min (ref >60)
Glucose, Bld: 101 mg/dL — ABNORMAL HIGH (ref 70–99)
Potassium: 3 mmol/L — ABNORMAL LOW (ref 3.5–5.1)
Sodium: 129 mmol/L — ABNORMAL LOW (ref 135–145)

## 2023-04-22 LAB — RESP PANEL BY RT-PCR (RSV, FLU A&B, COVID)  RVPGX2
Influenza A by PCR: POSITIVE — AB
Influenza B by PCR: NEGATIVE
Resp Syncytial Virus by PCR: NEGATIVE
SARS Coronavirus 2 by RT PCR: NEGATIVE

## 2023-04-22 LAB — TROPONIN I (HIGH SENSITIVITY): Troponin I (High Sensitivity): 5 ng/L (ref <18)

## 2023-04-22 MED ORDER — ACETAMINOPHEN 325 MG PO TABS
650.0000 mg | ORAL_TABLET | Freq: Four times a day (QID) | ORAL | Status: DC | PRN
  Administered 2023-04-24: 650 mg via ORAL
  Filled 2023-04-22: qty 2

## 2023-04-22 MED ORDER — CHLORTHALIDONE 25 MG PO TABS
25.0000 mg | ORAL_TABLET | Freq: Every day | ORAL | Status: DC
  Filled 2023-04-22: qty 1

## 2023-04-22 MED ORDER — ACETAMINOPHEN 650 MG RE SUPP: 650.0000 mg | Freq: Four times a day (QID) | RECTAL | Status: DC | PRN

## 2023-04-22 MED ORDER — SODIUM CHLORIDE 0.9 % IV SOLN
500.0000 mg | INTRAVENOUS | Status: DC
  Administered 2023-04-23: 500 mg via INTRAVENOUS
  Filled 2023-04-22: qty 5

## 2023-04-22 MED ORDER — ENOXAPARIN SODIUM 60 MG/0.6ML IJ SOSY
60.0000 mg | PREFILLED_SYRINGE | INTRAMUSCULAR | Status: DC
  Administered 2023-04-23 – 2023-04-25 (×3): 60 mg via SUBCUTANEOUS
  Filled 2023-04-22 (×3): qty 0.6

## 2023-04-22 MED ORDER — GUAIFENESIN ER 600 MG PO TB12
600.0000 mg | ORAL_TABLET | Freq: Two times a day (BID) | ORAL | Status: DC
  Administered 2023-04-23 – 2023-04-25 (×6): 600 mg via ORAL
  Filled 2023-04-22 (×6): qty 1

## 2023-04-22 MED ORDER — KETOROLAC TROMETHAMINE 30 MG/ML IJ SOLN
30.0000 mg | Freq: Once | INTRAMUSCULAR | Status: AC
  Administered 2023-04-22: 30 mg via INTRAMUSCULAR
  Filled 2023-04-22: qty 1

## 2023-04-22 MED ORDER — SODIUM CHLORIDE 0.9 % IV SOLN: INTRAVENOUS | Status: DC

## 2023-04-22 MED ORDER — METHYLPREDNISOLONE SODIUM SUCC 40 MG IJ SOLR
40.0000 mg | Freq: Two times a day (BID) | INTRAMUSCULAR | Status: DC
  Administered 2023-04-23: 40 mg via INTRAVENOUS
  Filled 2023-04-22: qty 1

## 2023-04-22 MED ORDER — SIMVASTATIN 20 MG PO TABS: 40.0000 mg | ORAL_TABLET | Freq: Every evening | ORAL | Status: DC

## 2023-04-22 MED ORDER — SENNOSIDES-DOCUSATE SODIUM 8.6-50 MG PO TABS: 1.0000 | ORAL_TABLET | Freq: Every evening | ORAL | Status: DC | PRN

## 2023-04-22 MED ORDER — IPRATROPIUM-ALBUTEROL 0.5-2.5 (3) MG/3ML IN SOLN
3.0000 mL | Freq: Four times a day (QID) | RESPIRATORY_TRACT | Status: DC
Start: 2023-04-23 — End: 2023-04-23
  Administered 2023-04-23 (×2): 3 mL via RESPIRATORY_TRACT
  Filled 2023-04-22 (×2): qty 3

## 2023-04-22 MED ORDER — VENLAFAXINE HCL ER 75 MG PO CP24
150.0000 mg | ORAL_CAPSULE | Freq: Every day | ORAL | Status: DC
  Administered 2023-04-23 – 2023-04-25 (×3): 150 mg via ORAL
  Filled 2023-04-22: qty 1
  Filled 2023-04-22 (×2): qty 2

## 2023-04-22 MED ORDER — PANTOPRAZOLE SODIUM 40 MG PO TBEC
40.0000 mg | DELAYED_RELEASE_TABLET | Freq: Every day | ORAL | Status: DC
  Administered 2023-04-23 – 2023-04-25 (×3): 40 mg via ORAL
  Filled 2023-04-22 (×3): qty 1

## 2023-04-22 MED ORDER — METHYLPREDNISOLONE SODIUM SUCC 125 MG IJ SOLR
125.0000 mg | Freq: Once | INTRAMUSCULAR | Status: AC
  Administered 2023-04-22: 125 mg via INTRAVENOUS
  Filled 2023-04-22: qty 2

## 2023-04-22 MED ORDER — ALBUTEROL SULFATE (2.5 MG/3ML) 0.083% IN NEBU
2.5000 mg | INHALATION_SOLUTION | RESPIRATORY_TRACT | Status: DC | PRN
  Administered 2023-04-24 (×2): 2.5 mg via RESPIRATORY_TRACT
  Filled 2023-04-22 (×2): qty 3

## 2023-04-22 MED ORDER — ASPIRIN 81 MG PO TBEC
81.0000 mg | DELAYED_RELEASE_TABLET | Freq: Every day | ORAL | Status: DC
  Administered 2023-04-23 – 2023-04-25 (×3): 81 mg via ORAL
  Filled 2023-04-22 (×3): qty 1

## 2023-04-22 MED ORDER — IPRATROPIUM-ALBUTEROL 0.5-2.5 (3) MG/3ML IN SOLN
3.0000 mL | Freq: Once | RESPIRATORY_TRACT | Status: AC
  Administered 2023-04-22: 3 mL via RESPIRATORY_TRACT
  Filled 2023-04-22: qty 3

## 2023-04-22 MED ORDER — OSELTAMIVIR PHOSPHATE 75 MG PO CAPS
75.0000 mg | ORAL_CAPSULE | Freq: Once | ORAL | Status: AC
  Administered 2023-04-22: 75 mg via ORAL
  Filled 2023-04-22: qty 1

## 2023-04-22 MED ORDER — POTASSIUM CHLORIDE CRYS ER 20 MEQ PO TBCR
20.0000 meq | EXTENDED_RELEASE_TABLET | ORAL | Status: AC
  Administered 2023-04-23 (×3): 20 meq via ORAL
  Filled 2023-04-22 (×3): qty 1

## 2023-04-22 NOTE — ED Provider Triage Note (Signed)
 Emergency Medicine Provider Triage Evaluation Note  GASPER HOPES , a 63 y.o. male  was evaluated in triage.  Pt complains of 3 weeks of cough, shortness of breath, and bilateral lower extremity swelling. Now having left side rib pain as well.    Physical Exam  BP (!) 135/90   Pulse 81   Temp 98.2 F (36.8 C)   Resp 19   Ht 5\' 9"  (1.753 m)   Wt 117.9 kg   SpO2 96%   BMI 38.40 kg/m  Gen:   Awake, no distress   Resp:  Normal effort  MSK:   Moves extremities without difficulty  Other:    Medical Decision Making  Medically screening exam initiated at 5:16 PM.  Appropriate orders placed.  Victorino December was informed that the remainder of the evaluation will be completed by another provider, this initial triage assessment does not replace that evaluation, and the importance of remaining in the ED until their evaluation is complete.    Chinita Pester, FNP 04/22/23 1717

## 2023-04-22 NOTE — ED Triage Notes (Signed)
 Pt comes with sob and leg swelling for about 3 weeks. Pt states cough. Pt states left sided rib pain when he coughs. Pt denies any recent falls  Pt states some tightness in chest also

## 2023-04-22 NOTE — H&P (Signed)
 History and Physical    Patient: Louis Barron ZOX:[id number] DOB: [birth year] DOA: 04/22/2023 DOS: the patient was seen and examined on 04/22/2023 PCP: Marisue Ivan, MD  Patient coming from: Home  Chief Complaint:  Chief Complaint  Patient presents with   Shortness of Breath   HPI: Louis Barron is a 63 y.o. male with medical history significant of November hypertension, hyperlipidemia, obesity,recurrent seasonal ED visits on account of cough and shortness of breath.Patient presented to the ED this evening with complaints of shortness of breath and some left sided chest pain. He said that he has been struggling with shortness of breath and cough for about 3 weeks. Cough is productive of yellowish sputum. He also reports subjective fever and chills.He has been taking ibuprofen for pain control on his left chest pain.  He denies any prior diagnosis of COPD or OSA. Denies cardiac history. He denies active smoking, admits to smoking history in his youth but quit more than 40 years ago. No nausea, vomiting, nor abdominal pain.  Review of Systems: unable to review all systems due to the inability of the patient to answer questions. Past Medical History:  Diagnosis Date   Heart murmur    as a child   Hyperlipidemia    Past Surgical History:  Procedure Laterality Date   CATARACT EXTRACTION W/PHACO Left 10/31/2021   Procedure: CATARACT EXTRACTION PHACO AND INTRAOCULAR LENS PLACEMENT (IOC) LEFT 7.60 00:44.7;  Surgeon: Galen Manila, MD;  Location: [facility];  Service: Ophthalmology;  Laterality: Left;  Requests early   COLONOSCOPY     DENTAL SURGERY  2023   Social History:  reports that he quit smoking about 42 years ago. His smoking use included cigarettes. He has never used smokeless tobacco. He reports current alcohol use of about 12.0 standard drinks of alcohol per week. No history on file for drug use.  Allergies  Allergen Reactions   Penicillins Rash    Family  History  Problem Relation Age of Onset   Breast cancer Maternal Aunt     Prior to Admission medications   Medication Sig Start Date End Date Taking? Authorizing Provider  azithromycin (ZITHROMAX) 250 MG tablet Take 2 tablets PO on day 1, then take 1 tablet PO daily for 4 more days 01/27/22   Loleta Rose, MD  chlorthalidone (HYGROTON) 25 MG tablet Take 25 mg by mouth daily.    [provider]  omeprazole (PRILOSEC OTC) 20 MG tablet Take 1 tablet (20 mg total) by mouth daily. 01/27/22 01/27/23  Loleta Rose, MD  simvastatin (ZOCOR) 40 MG tablet Take 40 mg by mouth daily.    [provider]  venlafaxine XR (EFFEXOR-XR) 75 MG 24 hr capsule Take 150 mg by mouth at bedtime.    [provider]    Physical Exam: Vitals:   04/22/23 1639 04/22/23 1641 04/22/23 2126 04/22/23 2150  BP:  (!) 135/90 134/76   Pulse:  81 88   Resp:  19 (!) 22   Temp:  98.2 F (36.8 C)  99.1 F (37.3 C)  TempSrc:    Oral  SpO2:  96% 100% 92%  Weight: 117.9 kg     Height: 5\' 9"  (1.753 m)     General: Centrally obese gentleman, not in any acute distress at this time. Head is atraumatic, neck supple, large neck with no obvious or palpable masses Chest: Significant of barrel-shaped chest Abdomen: Rotund, soft nontender Extremities without any significant pedal edema CNS shows no focal deficit Skin negative  for any new rash Cardiovascular: S1-S2 with ? murmur. data Reviewed: Sodium 129, potassium 3.0, chloride 93, bicarb 24, glucose 101, BUN 14, creatinine 0.74, calcium 8.3 Chest x-ray significant for shallow inspiration with bibasilar atelectasis.  No focal consolidation pleural effusion or pneumothorax.  Assessment and Plan: 63 year old with body habitus suggestive of chronic bronchitis and possible underlying obstructive sleep apnea.  Admitted on account of shortness of breath and coughing spells spanning 3 weeks duration.  Incidentally tested positive for influenza A.  Symptoms  concerning for possible COPD.  #1.  Acute on chronic bronchitis.  Patient with ongoing coughing spells of spotting over 3 weeks with productive sputum.  Patient has no formal diagnosis of COPD.  Patient may have underlying obstructive sleep apnea.  Patient will be optimized with Solu-Medrol, bronchodilators as appropriate.  #2.  Influenza A positive.  Patient however has been having symptoms spanning over 3 weeks.  Unclear if this may be acute.  Considering symptoms beyond 5 days, no indication for oseltamivir.  #3.  Chronic left-sided chest pain.  Secondary to old left rib fractures.  #4.  Chronic hyponatremia: Likely due to effects of chlorthalidone with salt wasting.  Patient is asymptomatic at this time.  #5.  Hypokalemia: Potassium replacement protocol.  Will order for serum magnesium levels  #6.  Class III severe obesity due to excess calories.  BMI of 38.  Left eye modification advised.    Advance Care Planning:   Code Status: Full Code   Consults: None  Family Communication: No family at bedside  Severity of Illness: The appropriate patient status for this patient is OBSERVATION. Observation status is judged to be reasonable and necessary in order to provide the required intensity of service to ensure the patient's safety. The patient's presenting symptoms, physical exam findings, and initial radiographic and laboratory data in the context of their medical condition is felt to place them at decreased risk for further clinical deterioration. Furthermore, it is anticipated that the patient will be medically stable for discharge from the hospital within 2 midnights of admission.   Author: Lilia Pro, MD 04/22/2023 10:42 PM  For on call review [email or web address].

## 2023-04-22 NOTE — ED Provider Notes (Signed)
 [facility] Provider Note    Event Date/Time   First MD Initiated Contact with Patient 04/22/23 2045     (approximate)   History   Shortness of Breath   HPI  Louis Barron is a 63 y.o. male with a history of hyperlipidemia presents with complaints of shortness of breath, chest tightness, for about 24 hours.  He also complains of headache and some bodyaches, denies fevers but some chills     Physical Exam   Triage Vital Signs: ED Triage Vitals  Encounter Vitals Group     BP 04/22/23 1641 (!) 135/90     Systolic BP Percentile --      Diastolic BP Percentile --      Pulse Rate 04/22/23 1641 81     Resp 04/22/23 1641 19     Temp 04/22/23 1641 98.2 F (36.8 C)     Temp Source 04/22/23 2150 Oral     SpO2 04/22/23 1641 96 %     Weight 04/22/23 1639 117.9 kg (260 lb)     Height 04/22/23 1639 1.753 m (5\' 9" )     Head Circumference --      Peak Flow --      Pain Score 04/22/23 1639 6     Pain Loc --      Pain Education --      Exclude from Growth Chart --     Most recent vital signs: Vitals:   04/22/23 2126 04/22/23 2150  BP: 134/76   Pulse: 88   Resp: (!) 22   Temp:  99.1 F (37.3 C)  SpO2: 100% 92%     General: Awake, no distress.  CV:  Good peripheral perfusion.  Resp:  Increased respiratory effort with tachypnea, scattered wheezes Abd:  No distention.  Other:     ED Results / Procedures / Treatments   Labs (all labs ordered are listed, but only abnormal results are displayed) Labs Reviewed  RESP PANEL BY RT-PCR (RSV, FLU A&B, COVID)  [id number] - Abnormal; Notable for the following components:      Result Value   Influenza A by PCR POSITIVE (*)    All other components within normal limits  BASIC METABOLIC PANEL - Abnormal; Notable for the following components:   Sodium 129 (*)    Potassium 3.0 (*)    Chloride 93 (*)    Glucose, Bld 101 (*)    Calcium 8.3 (*)    All other components within normal limits  CBC  TROPONIN I  (HIGH SENSITIVITY)  TROPONIN I (HIGH SENSITIVITY)     EKG  ED ECG REPORT I, Jene Every, the attending physician, personally viewed and interpreted this ECG.  Date: 04/22/2023  Rhythm: normal sinus rhythm QRS Axis: normal Intervals: normal ST/T Wave abnormalities: normal Narrative Interpretation: no evidence of acute ischemia    RADIOLOGY Chest x-ray viewed interpret by me, no acute abnormality    PROCEDURES:  Critical Care performed: yes  CRITICAL CARE Performed by: Jene Every   Total critical care time: 30 minutes  Critical care time was exclusive of separately billable procedures and treating other patients.  Critical care was necessary to treat or prevent imminent or life-threatening deterioration.  Critical care was time spent personally by me on the following activities: development of treatment plan with patient and/or surrogate as well as nursing, discussions with consultants, evaluation of patient's response to treatment, examination of patient, obtaining history from patient or surrogate, ordering and performing treatments and interventions,  ordering and review of laboratory studies, ordering and review of radiographic studies, pulse oximetry and re-evaluation of patient's condition.   Procedures   MEDICATIONS ORDERED IN ED: Medications  methylPREDNISolone sodium succinate (SOLU-MEDROL) 125 mg/2 mL injection 125 mg (has no administration in time range)  oseltamivir (TAMIFLU) capsule 75 mg (has no administration in time range)  ipratropium-albuterol (DUONEB) 0.5-2.5 (3) MG/3ML nebulizer solution 3 mL (3 mLs Nebulization Given 04/22/23 2118)  ipratropium-albuterol (DUONEB) 0.5-2.5 (3) MG/3ML nebulizer solution 3 mL (3 mLs Nebulization Given 04/22/23 2118)  ketorolac (TORADOL) 30 MG/ML injection 30 mg (30 mg Intramuscular Given 04/22/23 2118)     IMPRESSION / MDM / ASSESSMENT AND PLAN / ED COURSE  I reviewed the triage vital signs and the nursing  notes. Patient's presentation is most consistent with acute presentation with potential threat to life or bodily function.  Patient presents with shortness of breath, chest tightness as detailed above, differential includes pneumonia, viral illness, less likely pneumothorax, less likely ACS  Significant wheezing on exam, no evidence of pneumonia on chest x-ray  Lab work demonstrates normal white blood cell count, normal high sensitive troponin.  PCR positive for influenza A  Patient treated with DuoNebs with worsening and hypoxia, requiring 2 L nasal cannula now, will add Solu-Medrol, the patient will require admission to the hospital for further treatment, will add Tamiflu have consulted the hospitalist for admission      FINAL CLINICAL IMPRESSION(S) / ED DIAGNOSES   Final diagnoses:  Acute respiratory failure with hypoxia (HCC)  Influenza A     Rx / DC Orders   ED Discharge Orders     None        Note:  This document was prepared using Dragon voice recognition software and may include unintentional dictation errors.   Jene Every, MD 04/22/23 2214

## 2023-04-22 NOTE — Progress Notes (Signed)
 PHARMACIST - PHYSICIAN COMMUNICATION  CONCERNING:  Enoxaparin (Lovenox) for DVT Prophylaxis    RECOMMENDATION: Patient was prescribed enoxaprin 40mg  q24 hours for VTE prophylaxis.   Filed Weights   04/22/23 1639  Weight: 117.9 kg (260 lb)    Body mass index is 38.4 kg/m.  Estimated Creatinine Clearance: 119.8 mL/min (by C-G formula based on SCr of 0.74 mg/dL).   Based on [facility] policy patient is candidate for enoxaparin 0.5mg /kg TBW SQ every 24 hours based on BMI being >30.  DESCRIPTION: Pharmacy has adjusted enoxaparin dose per [facility] policy.  Patient is now receiving enoxaparin 0.5 mg/kg every 24 hours   Otelia Sergeant, PharmD, [facility] 04/22/2023 10:26 PM

## 2023-04-23 ENCOUNTER — Encounter: Payer: Self-pay | Admitting: Internal Medicine

## 2023-04-23 DIAGNOSIS — J101 Influenza due to other identified influenza virus with other respiratory manifestations: Secondary | ICD-10-CM | POA: Diagnosis not present

## 2023-04-23 DIAGNOSIS — J189 Pneumonia, unspecified organism: Secondary | ICD-10-CM | POA: Insufficient documentation

## 2023-04-23 DIAGNOSIS — E669 Obesity, unspecified: Secondary | ICD-10-CM | POA: Insufficient documentation

## 2023-04-23 LAB — CBC
HCT: 41.1 % (ref 39.0–52.0)
Hemoglobin: 14.7 g/dL (ref 13.0–17.0)
MCH: 30.1 pg (ref 26.0–34.0)
MCHC: 35.8 g/dL (ref 30.0–36.0)
MCV: 84.2 fL (ref 80.0–100.0)
Platelets: 219 10*3/uL (ref 150–400)
RBC: 4.88 MIL/uL (ref 4.22–5.81)
RDW: 13.1 % (ref 11.5–15.5)
WBC: 5.6 10*3/uL (ref 4.0–10.5)
nRBC: 0 % (ref 0.0–0.2)

## 2023-04-23 LAB — BASIC METABOLIC PANEL
Anion gap: 13 (ref 5–15)
BUN: 16 mg/dL (ref 8–23)
CO2: 24 mmol/L (ref 22–32)
Calcium: 8.4 mg/dL — ABNORMAL LOW (ref 8.9–10.3)
Chloride: 93 mmol/L — ABNORMAL LOW (ref 98–111)
Creatinine, Ser: 0.87 mg/dL (ref 0.61–1.24)
GFR, Estimated: >60 mL/min (ref >60)
Glucose, Bld: 158 mg/dL — ABNORMAL HIGH (ref 70–99)
Potassium: 3.1 mmol/L — ABNORMAL LOW (ref 3.5–5.1)
Sodium: 130 mmol/L — ABNORMAL LOW (ref 135–145)

## 2023-04-23 LAB — HIV ANTIBODY (ROUTINE TESTING W REFLEX): HIV Screen 4th Generation wRfx: NONREACTIVE

## 2023-04-23 LAB — TROPONIN I (HIGH SENSITIVITY): Troponin I (High Sensitivity): 7 ng/L (ref <18)

## 2023-04-23 LAB — MAGNESIUM: Magnesium: 2.3 mg/dL (ref 1.7–2.4)

## 2023-04-23 LAB — STREP PNEUMONIAE URINARY ANTIGEN: Strep Pneumo Urinary Antigen: NEGATIVE

## 2023-04-23 LAB — D-DIMER, QUANTITATIVE: D-Dimer, Quant: 0.52 ug{FEU}/mL — ABNORMAL HIGH (ref 0.00–0.50)

## 2023-04-23 LAB — PROCALCITONIN: Procalcitonin: <0.1 ng/mL

## 2023-04-23 MED ORDER — OSELTAMIVIR PHOSPHATE 75 MG PO CAPS
75.0000 mg | ORAL_CAPSULE | Freq: Two times a day (BID) | ORAL | Status: DC
  Administered 2023-04-23 – 2023-04-25 (×5): 75 mg via ORAL
  Filled 2023-04-23 (×6): qty 1

## 2023-04-23 MED ORDER — OSELTAMIVIR PHOSPHATE 75 MG PO CAPS: 75.0000 mg | ORAL_CAPSULE | Freq: Two times a day (BID) | ORAL | Status: DC

## 2023-04-23 MED ORDER — SODIUM CHLORIDE 0.9 % IV SOLN
2.0000 g | INTRAVENOUS | Status: DC
  Administered 2023-04-23 – 2023-04-24 (×2): 2 g via INTRAVENOUS
  Filled 2023-04-23 (×2): qty 20

## 2023-04-23 MED ORDER — POTASSIUM CHLORIDE CRYS ER 20 MEQ PO TBCR
60.0000 meq | EXTENDED_RELEASE_TABLET | Freq: Once | ORAL | Status: AC
  Administered 2023-04-23: 60 meq via ORAL
  Filled 2023-04-23: qty 3

## 2023-04-23 MED ORDER — AZITHROMYCIN 250 MG PO TABS
250.0000 mg | ORAL_TABLET | Freq: Every day | ORAL | Status: DC
  Administered 2023-04-24: 250 mg via ORAL
  Filled 2023-04-23: qty 1

## 2023-04-23 NOTE — ED Notes (Signed)
 Pt moved by this RN from room 8 to 38. Pt is SOB upon exertion and 89% on RA when O2 is removed. Pt is caox4.

## 2023-04-23 NOTE — Plan of Care (Signed)

## 2023-04-23 NOTE — ED Notes (Signed)
 RN to bedside to answer call bell. Pt needs to be unhooked to go have a BM in the bathroom across the hall.

## 2023-04-23 NOTE — Progress Notes (Signed)
 PROGRESS NOTE    Louis Barron  UJW:[id number] DOB: [birth year] DOA: 04/22/2023 PCP: Marisue Ivan, MD  Outpatient Specialists: none    Brief Narrative:   From admission h and p Louis Barron is a 63 y.o. male with medical history significant of November hypertension, hyperlipidemia, obesity,recurrent seasonal ED visits on account of cough and shortness of breath.Patient presented to the ED this evening with complaints of shortness of breath and some left sided chest pain. He said that he has been struggling with shortness of breath and cough for about 3 weeks. Cough is productive of yellowish sputum. He also reports subjective fever and chills.He has been taking ibuprofen for pain control on his left chest pain.   Assessment & Plan:   Principal Problem:   Influenza A Active Problems:   Obesity (BMI 30-39.9)  # Influenza A # CAP 3 weeks of symptoms. No leukocytosis, cxr equivocal for infiltrate (read says atelectasis), but patient reports sig improvement in symptoms from yesterday to today. - will continue tamiflu as patient is hospitalized for the flu - will also continue abx (ceftriaxone/azithromycin) - f/u procal - cough is dry but if turns productive would check sputum culture - f/u urine antigens, hiv  # Acute hypoxic respiratory failure 2/2 above process, initially on 4 liters now weaned to 2 - [place] O2, wean as able - f/u dimer - will hold further steroids - no wheezing, no history asthma or copd  # HTN Bp wnl - holding chlorthalidone as below  # Hyponatremia, chronic # Hypokalemia Both likely 2/2 chlorthalidone - replete K and f/u MG - trend sodium - would discontinue chlorthalidone and substitute an alternate agent  # Obesity noted   DVT prophylaxis: lovenox Code Status: full Family Communication: none at bedside  Level of care: Med-Surg Status is: Observation    Consultants:  none  Procedures: none  Antimicrobials:  Ceftriaxone/azithromycin     Subjective: Reports ongoing cough (dry) and sob  Objective: Vitals:   04/23/23 0500 04/23/23 0533 04/23/23 0700 04/23/23 0800  BP: (!) 142/68 138/79 129/74 132/77  Pulse: 85 85 71 74  Resp: (!) 24  (!) 22 18  Temp:  99 F (37.2 C)    TempSrc:      SpO2: 90% 96% 92% 91%  Weight:      Height:        Intake/Output Summary (Last 24 hours) at 04/23/2023 0933 Last data filed at 04/23/2023 0828 Gross per 24 hour  Intake 250 ml  Output 550 ml  Net -300 ml   Filed Weights   04/22/23 1639  Weight: 117.9 kg    Examination:  General exam: Appears calm and comfortable  Respiratory system: scattered rales bilaterally Cardiovascular system: S1 & S2 heard, RRR.   Gastrointestinal system: Abdomen is obese, soft and nontender.   Central nervous system: Alert and oriented. No focal neurological deficits. Extremities: Symmetric 5 x 5 power. No LE edema Skin: No rashes, lesions or ulcers Psychiatry: Judgement and insight appear normal. Mood & affect appropriate.     Data Reviewed: I have personally reviewed following labs and imaging studies  CBC: Recent Labs  Lab 04/22/23 1640 04/23/23 0558  WBC 7.3 5.6  HGB 14.8 14.7  HCT 42.2 41.1  MCV 85.6 84.2  PLT 304 219   Basic Metabolic Panel: Recent Labs  Lab 04/22/23 1640 04/23/23 0558  NA 129* 130*  K 3.0* 3.1*  CL 93* 93*  CO2 24 24  GLUCOSE 101* 158*  BUN 14 16  CREATININE 0.74 0.87  CALCIUM 8.3* 8.4*   GFR: Estimated Creatinine Clearance: 110.1 mL/min (by C-G formula based on SCr of 0.87 mg/dL). Liver Function Tests: No results for input(s): "AST", "ALT", "ALKPHOS", "BILITOT", "PROT", "ALBUMIN" in the last 168 hours. No results for input(s): "LIPASE", "AMYLASE" in the last 168 hours. No results for input(s): "AMMONIA" in the last 168 hours. Coagulation Profile: No results for input(s): "INR", "PROTIME" in the last 168 hours. Cardiac Enzymes: No results for input(s): "CKTOTAL", "CKMB", "CKMBINDEX",  "TROPONINI" in the last 168 hours. BNP (last 3 results) No results for input(s): "PROBNP" in the last 8760 hours. HbA1C: No results for input(s): "HGBA1C" in the last 72 hours. CBG: No results for input(s): "GLUCAP" in the last 168 hours. Lipid Profile: No results for input(s): "CHOL", "HDL", "LDLCALC", "TRIG", "CHOLHDL", "LDLDIRECT" in the last 72 hours. Thyroid Function Tests: No results for input(s): "TSH", "T4TOTAL", "FREET4", "T3FREE", "THYROIDAB" in the last 72 hours. Anemia Panel: No results for input(s): "VITAMINB12", "FOLATE", "FERRITIN", "TIBC", "IRON", "RETICCTPCT" in the last 72 hours. Urine analysis: No results found for: "COLORURINE", "APPEARANCEUR", "LABSPEC", "PHURINE", "GLUCOSEU", "HGBUR", "BILIRUBINUR", "KETONESUR", "PROTEINUR", "UROBILINOGEN", "NITRITE", "LEUKOCYTESUR" Sepsis Labs: @LABRCNTIP (procalcitonin:4,lacticidven:4)  ) Recent Results (from the past 240 hours)  Resp panel by RT-PCR (RSV, Flu A&B, Covid) Anterior Nasal Swab     Status: Abnormal   Collection Time: 04/22/23  4:41 PM   Specimen: Anterior Nasal Swab  Result Value Ref Range Status   SARS Coronavirus 2 by RT PCR NEGATIVE NEGATIVE Final    Comment: (NOTE) SARS-CoV-2 target nucleic acids are NOT DETECTED.  The SARS-CoV-2 RNA is generally detectable in upper respiratory specimens during the acute phase of infection. The lowest concentration of SARS-CoV-2 viral copies this assay can detect is 138 copies/mL. A negative result does not preclude SARS-Cov-2 infection and should not be used as the sole basis for treatment or other patient management decisions. A negative result may occur with  improper specimen collection/handling, submission of specimen other than nasopharyngeal swab, presence of viral mutation(s) within the areas targeted by this assay, and inadequate number of viral copies(<138 copies/mL). A negative result must be combined with clinical observations, patient history, and  epidemiological information. The expected result is Negative.  Fact Sheet for Patients:  BloggerCourse.com  Fact Sheet for Healthcare Providers:  SeriousBroker.it  This test is no t yet approved or cleared by the [place] FDA and  has been authorized for detection and/or diagnosis of SARS-CoV-2 by FDA under an Emergency Use Authorization (EUA). This EUA will remain  in effect (meaning this test can be used) for the duration of the COVID-19 declaration under Section 564(b)(1) of the Act, 21 U.S.C.section 360bbb-3(b)(1), unless the authorization is terminated  or revoked sooner.       Influenza A by PCR POSITIVE (A) NEGATIVE Final   Influenza B by PCR NEGATIVE NEGATIVE Final    Comment: (NOTE) The Xpert Xpress SARS-CoV-2/FLU/RSV plus assay is intended as an aid in the diagnosis of influenza from Nasopharyngeal swab specimens and should not be used as a sole basis for treatment. Nasal washings and aspirates are unacceptable for Xpert Xpress SARS-CoV-2/FLU/RSV testing.  Fact Sheet for Patients: BloggerCourse.com  Fact Sheet for Healthcare Providers: SeriousBroker.it  This test is not yet approved or cleared by the [place] FDA and has been authorized for detection and/or diagnosis of SARS-CoV-2 by FDA under an Emergency Use Authorization (EUA). This EUA will remain in effect (meaning this test can be used) for the duration of the COVID-19  declaration under Section 564(b)(1) of the Act, 21 U.S.C. section 360bbb-3(b)(1), unless the authorization is terminated or revoked.     Resp Syncytial Virus by PCR NEGATIVE NEGATIVE Final    Comment: (NOTE) Fact Sheet for Patients: BloggerCourse.com  Fact Sheet for Healthcare Providers: SeriousBroker.it  This test is not yet approved or cleared by the [place] FDA and has  been authorized for detection and/or diagnosis of SARS-CoV-2 by FDA under an Emergency Use Authorization (EUA). This EUA will remain in effect (meaning this test can be used) for the duration of the COVID-19 declaration under Section 564(b)(1) of the Act, 21 U.S.C. section 360bbb-3(b)(1), unless the authorization is terminated or revoked.  Performed at [facility], [address] [place], [place] [address]          Radiology Studies: DG Chest 2 View Result Date: 04/22/2023 CLINICAL DATA:  Chest pain. EXAM: CHEST - 2 VIEW COMPARISON:  Chest radiograph dated 01/27/2022. FINDINGS: Shallow inspiration with bibasilar atelectasis. No focal consolidation, pleural effusion or pneumothorax. Stable cardiac silhouette. No acute osseous pathology. IMPRESSION: Shallow inspiration with bibasilar atelectasis. Electronically Signed   By: Elgie Collard M.D.   On: 04/22/2023 19:12        Scheduled Meds:  aspirin EC  81 mg Oral Daily   enoxaparin (LOVENOX) injection  60 mg Subcutaneous Q24H   guaiFENesin  600 mg Oral BID   ipratropium-albuterol  3 mL Nebulization Q6H   oseltamivir  75 mg Oral BID   pantoprazole  40 mg Oral Daily   potassium chloride  60 mEq Oral Once   simvastatin  40 mg Oral QPM   venlafaxine XR  150 mg Oral Daily   Continuous Infusions:  sodium chloride 75 mL/hr at 04/23/23 0841     LOS: 0 days     Silvano Bilis, MD [facility]   If 7PM-7AM, please contact night-coverage [email or web address] Password [facility] 04/23/2023, 9:33 AM

## 2023-04-24 ENCOUNTER — Inpatient Hospital Stay: Payer: 59

## 2023-04-24 DIAGNOSIS — J101 Influenza due to other identified influenza virus with other respiratory manifestations: Secondary | ICD-10-CM | POA: Diagnosis not present

## 2023-04-24 LAB — BASIC METABOLIC PANEL
Anion gap: 11 (ref 5–15)
BUN: 21 mg/dL (ref 8–23)
CO2: 24 mmol/L (ref 22–32)
Calcium: 8.3 mg/dL — ABNORMAL LOW (ref 8.9–10.3)
Chloride: 98 mmol/L (ref 98–111)
Creatinine, Ser: 0.93 mg/dL (ref 0.61–1.24)
GFR, Estimated: >60 mL/min (ref >60)
Glucose, Bld: 97 mg/dL (ref 70–99)
Potassium: 3.4 mmol/L — ABNORMAL LOW (ref 3.5–5.1)
Sodium: 133 mmol/L — ABNORMAL LOW (ref 135–145)

## 2023-04-24 LAB — RESPIRATORY PANEL BY PCR

## 2023-04-24 LAB — HIV ANTIBODY (ROUTINE TESTING W REFLEX): HIV Screen 4th Generation wRfx: NONREACTIVE

## 2023-04-24 MED ORDER — POTASSIUM CHLORIDE CRYS ER 20 MEQ PO TBCR: 40.0000 meq | EXTENDED_RELEASE_TABLET | Freq: Once | ORAL | Status: DC

## 2023-04-24 MED ORDER — POTASSIUM CHLORIDE 20 MEQ PO PACK
20.0000 meq | PACK | Freq: Once | ORAL | Status: AC
  Administered 2023-04-24: 20 meq via ORAL
  Filled 2023-04-24: qty 1

## 2023-04-24 MED ORDER — IOHEXOL 350 MG/ML SOLN
100.0000 mL | Freq: Once | INTRAVENOUS | Status: AC | PRN
  Administered 2023-04-24: 100 mL via INTRAVENOUS

## 2023-04-24 NOTE — Plan of Care (Signed)

## 2023-04-24 NOTE — Progress Notes (Signed)
 PROGRESS NOTE    EUAL LINDSTROM  YNW:[id number] DOB: 07-03-1960 DOA: 04/22/2023 PCP: Marisue Ivan, MD    Assessment & Plan:   Principal Problem:   Influenza A Active Problems:   Obesity (BMI 30-39.9)   CAP (community acquired pneumonia)  Assessment and Plan: Influenza A: continue on tamiflu. Continue w/ supportive care. Droplet precautions    Unlikely CAP: No leukocytosis, cxr showed atelectasis and not infiltrate, unlikely CAP. Procal < 0.10. Will likely d/c abx after CTA chest done & read   Elevated d-dimer: CTA chest ordered to r/o PE. Hx of recent long car rides.  Acute hypoxic respiratory failure: likely secondary to influenza. Continue on supplemental oxygen and wean as tolerated    HTN: holding home dose of chlorthalidone as BP is WNL.    Chronic hyponatremia: labile. Will continue to monitor   Hypokalemia: potassium given  Obesity: BMI 38.4. Would benefit from weight loss        DVT prophylaxis: lovenox  Code Status: full  Family Communication: Disposition Plan: likely d/c back home   Level of care: Med-Surg  Status is: Inpatient Remains inpatient appropriate because: severity of illness, CTA chest ordered     Consultants:    Procedures:   Antimicrobials: azithromycin, rocephin    Subjective: Pt c/o shortness of breath   Objective: Vitals:   04/23/23 1700 04/23/23 1948 04/24/23 0232 04/24/23 0749  BP: 129/83 129/89 111/77 135/86  Pulse: 80 79 75 67  Resp: 20 (!) 22 (!) 22 19  Temp:  98.2 F (36.8 C) 97.6 F (36.4 C) 98.2 F (36.8 C)  TempSrc:  Oral Oral   SpO2: 92% 94% 94% 96%  Weight:      Height:        Intake/Output Summary (Last 24 hours) at 04/24/2023 1450 Last data filed at 04/24/2023 1100 Gross per 24 hour  Intake 396.67 ml  Output --  Net 396.67 ml   Filed Weights   04/22/23 1639  Weight: 117.9 kg    Examination:  General exam: Appears calm and comfortable  Respiratory system: diminished breath sounds b/l   Cardiovascular system: S1 & S2+. No rubs, gallops or clicks.  Gastrointestinal system: Abdomen is obese, soft and nontender.  Hypoactive bowel sounds heard. Central nervous system: Alert and oriented. Moves all extremities  Psychiatry: Judgement and insight appear normal. Mood & affect appropriate.     Data Reviewed: I have personally reviewed following labs and imaging studies  CBC: Recent Labs  Lab 04/22/23 1640 04/23/23 0558  WBC 7.3 5.6  HGB 14.8 14.7  HCT 42.2 41.1  MCV 85.6 84.2  PLT 304 219   Basic Metabolic Panel: Recent Labs  Lab 04/22/23 1640 04/23/23 0500 04/23/23 0558 04/24/23 0900  NA 129*  --  130* 133*  K 3.0*  --  3.1* 3.4*  CL 93*  --  93* 98  CO2 24  --  24 24  GLUCOSE 101*  --  158* 97  BUN 14  --  16 21  CREATININE 0.74  --  0.87 0.93  CALCIUM 8.3*  --  8.4* 8.3*  MG  --  2.3  --   --    GFR: Estimated Creatinine Clearance: 103 mL/min (by C-G formula based on SCr of 0.93 mg/dL). Liver Function Tests: No results for input(s): "AST", "ALT", "ALKPHOS", "BILITOT", "PROT", "ALBUMIN" in the last 168 hours. No results for input(s): "LIPASE", "AMYLASE" in the last 168 hours. No results for input(s): "AMMONIA" in the last 168 hours. Coagulation  Profile: No results for input(s): "INR", "PROTIME" in the last 168 hours. Cardiac Enzymes: No results for input(s): "CKTOTAL", "CKMB", "CKMBINDEX", "TROPONINI" in the last 168 hours. BNP (last 3 results) No results for input(s): "PROBNP" in the last 8760 hours. HbA1C: No results for input(s): "HGBA1C" in the last 72 hours. CBG: No results for input(s): "GLUCAP" in the last 168 hours. Lipid Profile: No results for input(s): "CHOL", "HDL", "LDLCALC", "TRIG", "CHOLHDL", "LDLDIRECT" in the last 72 hours. Thyroid Function Tests: No results for input(s): "TSH", "T4TOTAL", "FREET4", "T3FREE", "THYROIDAB" in the last 72 hours. Anemia Panel: No results for input(s): "VITAMINB12", "FOLATE", "FERRITIN", "TIBC",  "IRON", "RETICCTPCT" in the last 72 hours. Sepsis Labs: Recent Labs  Lab 04/23/23 0500  PROCALCITON <0.10    Recent Results (from the past 240 hours)  Resp panel by RT-PCR (RSV, Flu A&B, Covid) Anterior Nasal Swab     Status: Abnormal   Collection Time: 04/22/23  4:41 PM   Specimen: Anterior Nasal Swab  Result Value Ref Range Status   SARS Coronavirus 2 by RT PCR NEGATIVE NEGATIVE Final    Comment: (NOTE) SARS-CoV-2 target nucleic acids are NOT DETECTED.  The SARS-CoV-2 RNA is generally detectable in upper respiratory specimens during the acute phase of infection. The lowest concentration of SARS-CoV-2 viral copies this assay can detect is 138 copies/mL. A negative result does not preclude SARS-Cov-2 infection and should not be used as the sole basis for treatment or other patient management decisions. A negative result may occur with  improper specimen collection/handling, submission of specimen other than nasopharyngeal swab, presence of viral mutation(s) within the areas targeted by this assay, and inadequate number of viral copies(<138 copies/mL). A negative result must be combined with clinical observations, patient history, and epidemiological information. The expected result is Negative.  Fact Sheet for Patients:  BloggerCourse.com  Fact Sheet for Healthcare Providers:  SeriousBroker.it  This test is no t yet approved or cleared by the [place] FDA and  has been authorized for detection and/or diagnosis of SARS-CoV-2 by FDA under an Emergency Use Authorization (EUA). This EUA will remain  in effect (meaning this test can be used) for the duration of the COVID-19 declaration under Section 564(b)(1) of the Act, 21 U.S.C.section 360bbb-3(b)(1), unless the authorization is terminated  or revoked sooner.       Influenza A by PCR POSITIVE (A) NEGATIVE Final   Influenza B by PCR NEGATIVE NEGATIVE Final    Comment:  (NOTE) The Xpert Xpress SARS-CoV-2/FLU/RSV plus assay is intended as an aid in the diagnosis of influenza from Nasopharyngeal swab specimens and should not be used as a sole basis for treatment. Nasal washings and aspirates are unacceptable for Xpert Xpress SARS-CoV-2/FLU/RSV testing.  Fact Sheet for Patients: BloggerCourse.com  Fact Sheet for Healthcare Providers: SeriousBroker.it  This test is not yet approved or cleared by the [place] FDA and has been authorized for detection and/or diagnosis of SARS-CoV-2 by FDA under an Emergency Use Authorization (EUA). This EUA will remain in effect (meaning this test can be used) for the duration of the COVID-19 declaration under Section 564(b)(1) of the Act, 21 U.S.C. section 360bbb-3(b)(1), unless the authorization is terminated or revoked.     Resp Syncytial Virus by PCR NEGATIVE NEGATIVE Final    Comment: (NOTE) Fact Sheet for Patients: BloggerCourse.com  Fact Sheet for Healthcare Providers: SeriousBroker.it  This test is not yet approved or cleared by the [place] FDA and has been authorized for detection and/or diagnosis of SARS-CoV-2 by FDA  under an Emergency Use Authorization (EUA). This EUA will remain in effect (meaning this test can be used) for the duration of the COVID-19 declaration under Section 564(b)(1) of the Act, 21 U.S.C. section 360bbb-3(b)(1), unless the authorization is terminated or revoked.  Performed at [facility], [address], [place], [place] [address]          Radiology Studies: DG Chest 2 View Result Date: 04/22/2023 CLINICAL DATA:  Chest pain. EXAM: CHEST - 2 VIEW COMPARISON:  Chest radiograph dated 01/27/2022. FINDINGS: Shallow inspiration with bibasilar atelectasis. No focal consolidation, pleural effusion or pneumothorax. Stable cardiac silhouette. No acute osseous  pathology. IMPRESSION: Shallow inspiration with bibasilar atelectasis. Electronically Signed   By: Elgie Collard M.D.   On: 04/22/2023 19:12        Scheduled Meds:  aspirin EC  81 mg Oral Daily   azithromycin  250 mg Oral Daily   enoxaparin (LOVENOX) injection  60 mg Subcutaneous Q24H   guaiFENesin  600 mg Oral BID   oseltamivir  75 mg Oral BID   pantoprazole  40 mg Oral Daily   venlafaxine XR  150 mg Oral Daily   Continuous Infusions:  cefTRIAXone (ROCEPHIN)  IV 2 g (04/24/23 0943)     LOS: 1 day      Charise Killian, MD [facility] Pager [phone]  If 7PM-7AM, please contact night-coverage [email or web address] 04/24/2023, 2:50 PM  '

## 2023-04-24 NOTE — TOC CM/SW Note (Signed)
 Transition of Care [facility]) - Inpatient Brief Assessment   Patient Details  Name: OSIAH HARING MRN: [id number] Date of Birth: 1960-07-30  Transition of Care [facility]) CM/SW Contact:    Chapman Fitch, RN Phone Number: 04/24/2023, 12:18 PM   Clinical Narrative:   Transition of Care [facility]) Screening Note   Patient Details  Name: BEVAN DISNEY Date of Birth: [birth year]   Transition of Care [facility]) CM/SW Contact:    Chapman Fitch, RN Phone Number: 04/24/2023, 12:18 PM    Transition of Care Department [facility]) has reviewed patient and no TOC needs have been identified at this time.   If new patient transition needs arise, please place a TOC consult.   Patient currently on acute O2.  If patient requires O2 at discharge will require qualifying sats and order   Transition of Care Asessment: Insurance and Status: Insurance coverage has been reviewed Patient has primary care physician: Yes     Prior/Current Home Services: No current home services Social Drivers of Health Review: SDOH reviewed no interventions necessary Readmission risk has been reviewed: Yes Transition of care needs: no transition of care needs at this time

## 2023-04-25 DIAGNOSIS — J101 Influenza due to other identified influenza virus with other respiratory manifestations: Secondary | ICD-10-CM | POA: Diagnosis not present

## 2023-04-25 MED ORDER — TRAMADOL HCL 50 MG PO TABS: 50.0000 mg | ORAL_TABLET | Freq: Four times a day (QID) | ORAL | 0 refills | Status: AC | PRN

## 2023-04-25 MED ORDER — OSELTAMIVIR PHOSPHATE 75 MG PO CAPS: 75.0000 mg | ORAL_CAPSULE | Freq: Two times a day (BID) | ORAL | 0 refills | Status: AC

## 2023-04-25 NOTE — Plan of Care (Signed)

## 2023-04-25 NOTE — Discharge Summary (Signed)
 Physician Discharge Summary  Louis Barron QMV:[id number] DOB: Jan 05, 1961 DOA: 04/22/2023  PCP: Marisue Ivan, MD  Admit date: 04/22/2023 Discharge date: 04/25/2023  Admitted From: home  Disposition:  home  Recommendations for Outpatient Follow-up:  Follow up with PCP in 1-2 weeks  Home Health: no Equipment/Devices: 2L [place]   Discharge Condition: stable  CODE STATUS: full  Diet recommendation: Heart Healthy  Brief/Interim Summary: HPI was taken from Dr. Margette Fast: Louis Barron is a 63 y.o. male with medical history significant of November hypertension, hyperlipidemia, obesity,recurrent seasonal ED visits on account of cough and shortness of breath.Patient presented to the ED this evening with complaints of shortness of breath and some left sided chest pain. He said that he has been struggling with shortness of breath and cough for about 3 weeks. Cough is productive of yellowish sputum. He also reports subjective fever and chills.He has been taking ibuprofen for pain control on his left chest pain.   He denies any prior diagnosis of COPD or OSA. Denies cardiac history. He denies active smoking, admits to smoking history in his youth but quit more than 40 years ago. No nausea, vomiting, nor abdominal pain.  Discharge Diagnoses:  Principal Problem:   Influenza A Active Problems:   Obesity (BMI 30-39.9)   CAP (community acquired pneumonia)  Influenza A: continue on tamiflu. Continue w/ supportive care. Droplet precautions    Left 6th & 7th rib fractures: acute & some old rib fractures noted as per CTA. Encourage incentive spirometry. Continue w/ supportive care  Unlikely CAP: No leukocytosis, cxr showed atelectasis and not infiltrate, unlikely CAP. Procal < 0.10. CTA chest was neg for PE & pneumonia. Abxs were d/c  Elevated d-dimer: CTA chest was neg for PE. Hx of recent long car rides.  Acute hypoxic respiratory failure: likely secondary to influenza. Continue on supplemental  oxygen and wean as tolerated. Unable to wean from supplemental oxygen prior to d/c so pt was d/c home w/ 2L [place]    HTN: restart home dose of chlorthalidone at d/c    Chronic hyponatremia: labile. Will continue to monitor   Hypokalemia: potassium given  Obesity: BMI 38.4. Would benefit from weight loss   Discharge Instructions  Discharge Instructions     Diet - low sodium heart healthy   Complete by: As directed    Discharge instructions   Complete by: As directed    F/u w/ PCP in 1-2 weeks. Will need to have an outpatient oxygen desaturation test done at your  PCP's office to determine whether or not you still need oxygen   Increase activity slowly   Complete by: As directed       Allergies as of 04/25/2023       Reactions   Penicillins Rash        Medication List     STOP taking these medications    azithromycin 250 MG tablet Commonly known as: Zithromax       TAKE these medications    chlorthalidone 25 MG tablet Commonly known as: HYGROTON Take 25 mg by mouth daily.   omeprazole 20 MG tablet Commonly known as: PriLOSEC OTC Take 1 tablet (20 mg total) by mouth daily.   oseltamivir 75 MG capsule Commonly known as: TAMIFLU Take 1 capsule (75 mg total) by mouth 2 (two) times daily for 2 days.   simvastatin 40 MG tablet Commonly known as: ZOCOR Take 40 mg by mouth daily.   traMADol 50 MG tablet Commonly known as: Ultram Take 1  tablet (50 mg total) by mouth every 6 (six) hours as needed for up to 3 days for moderate pain (pain score 4-6) or severe pain (pain score 7-10).   venlafaxine XR 150 MG 24 hr capsule Commonly known as: EFFEXOR-XR Take 150 mg by mouth at bedtime.               Durable Medical Equipment  (From admission, onward)           Start     Ordered   04/25/23 1027  For home use only DME oxygen  Once       Question Answer Comment  Length of Need 6 Months   Mode or (Route) Nasal cannula   Liters per Minute 2   Frequency  Continuous (stationary and portable oxygen unit needed)   Oxygen conserving device Yes   Oxygen delivery system Gas      04/25/23 1026            Allergies  Allergen Reactions   Penicillins Rash    Consultations:    Procedures/Studies: CT Angio Chest Pulmonary Embolism (PE) W or WO Contrast Result Date: 04/24/2023 CLINICAL DATA:  Shortness of breath and elevated D-dimer. Concern for pulmonary edema. EXAM: CT ANGIOGRAPHY CHEST WITH CONTRAST TECHNIQUE: Multidetector CT imaging of the chest was performed using the standard protocol during bolus administration of intravenous contrast. Multiplanar CT image reconstructions and MIPs were obtained to evaluate the vascular anatomy. RADIATION DOSE REDUCTION: This exam was performed according to the departmental dose-optimization program which includes automated exposure control, adjustment of the mA and/or kV according to patient size and/or use of iterative reconstruction technique. CONTRAST:  OMNIPAQUE IOHEXOL 350 MG/ML SOLN COMPARISON:  Chest pain and dated 04/22/2023. FINDINGS: Cardiovascular: There is no cardiomegaly. No significant pericardial effusion. Mild atherosclerotic calcification of the thoracic aorta. No aneurysmal dilatation. No pulmonary artery embolus identified. Mediastinum/Nodes: Mildly enlarged right hilar lymph nodes measure up to 13 mm. The esophagus is grossly unremarkable. No mediastinal fluid collection. Lungs/Pleura: Small left pleural effusion. Minimal left lung base atelectasis. No consolidative changes or pneumothorax. The central airways are patent. Upper Abdomen: Fatty liver. Indeterminate hypodense lesion in the left lobe of the liver adjacent to the liver not evaluated on this CT. This can be better evaluated with ultrasound, MRI or dedicated contrast enhanced CT of the abdomen pelvis. Musculoskeletal: Acute displaced fracture of the lateral left sixth and seventh ribs. Subacute appearing fracture of the posterior  left eighth rib and old healed fracture of the lateral left eighth rib. Review of the MIP images confirms the above findings. IMPRESSION: 1. No CT evidence of pulmonary artery embolus. 2. Small left pleural effusion. 3. Acute fractures of the left 6 and seventh ribs. Additional subacute and old left rib fractures. No pneumothorax. 4.  Aortic Atherosclerosis (ICD10-I70.0). Electronically Signed   By: Elgie Collard M.D.   On: 04/24/2023 15:56   DG Chest 2 View Result Date: 04/22/2023 CLINICAL DATA:  Chest pain. EXAM: CHEST - 2 VIEW COMPARISON:  Chest radiograph dated 01/27/2022. FINDINGS: Shallow inspiration with bibasilar atelectasis. No focal consolidation, pleural effusion or pneumothorax. Stable cardiac silhouette. No acute osseous pathology. IMPRESSION: Shallow inspiration with bibasilar atelectasis. Electronically Signed   By: Elgie Collard M.D.   On: 04/22/2023 19:12   (Echo, Carotid, EGD, Colonoscopy, ERCP)    Subjective: Pt c/o rib pain    Discharge Exam: Vitals:   04/25/23 0400 04/25/23 0746  BP:  121/84  Pulse:  65  Resp:  16  Temp:    SpO2: 94% 92%   Vitals:   04/24/23 2023 04/25/23 0328 04/25/23 0400 04/25/23 0746  BP: 127/77 124/78  121/84  Pulse: 74 66  65  Resp: 18 20  16   Temp: 97.9 F (36.6 C) 97.6 F (36.4 C)    TempSrc:  Oral    SpO2: 96% 91% 94% 92%  Weight:      Height:        General: Pt is alert, awake, not in acute distress Cardiovascular:  S1/S2 +, no rubs, no gallops Respiratory: decreased breath sounds b/l  Abdominal: Soft, NT, obese, bowel sounds + Extremities: no edema, no cyanosis    The results of significant diagnostics from this hospitalization (including imaging, microbiology, ancillary and laboratory) are listed below for reference.     Microbiology: Recent Results (from the past 240 hours)  Resp panel by RT-PCR (RSV, Flu A&B, Covid) Anterior Nasal Swab     Status: Abnormal   Collection Time: 04/22/23  4:41 PM   Specimen:  Anterior Nasal Swab  Result Value Ref Range Status   SARS Coronavirus 2 by RT PCR NEGATIVE NEGATIVE Final    Comment: (NOTE) SARS-CoV-2 target nucleic acids are NOT DETECTED.  The SARS-CoV-2 RNA is generally detectable in upper respiratory specimens during the acute phase of infection. The lowest concentration of SARS-CoV-2 viral copies this assay can detect is 138 copies/mL. A negative result does not preclude SARS-Cov-2 infection and should not be used as the sole basis for treatment or other patient management decisions. A negative result may occur with  improper specimen collection/handling, submission of specimen other than nasopharyngeal swab, presence of viral mutation(s) within the areas targeted by this assay, and inadequate number of viral copies(<138 copies/mL). A negative result must be combined with clinical observations, patient history, and epidemiological information. The expected result is Negative.  Fact Sheet for Patients:  BloggerCourse.com  Fact Sheet for Healthcare Providers:  SeriousBroker.it  This test is no t yet approved or cleared by the [place] FDA and  has been authorized for detection and/or diagnosis of SARS-CoV-2 by FDA under an Emergency Use Authorization (EUA). This EUA will remain  in effect (meaning this test can be used) for the duration of the COVID-19 declaration under Section 564(b)(1) of the Act, 21 U.S.C.section 360bbb-3(b)(1), unless the authorization is terminated  or revoked sooner.       Influenza A by PCR POSITIVE (A) NEGATIVE Final   Influenza B by PCR NEGATIVE NEGATIVE Final    Comment: (NOTE) The Xpert Xpress SARS-CoV-2/FLU/RSV plus assay is intended as an aid in the diagnosis of influenza from Nasopharyngeal swab specimens and should not be used as a sole basis for treatment. Nasal washings and aspirates are unacceptable for Xpert Xpress  SARS-CoV-2/FLU/RSV testing.  Fact Sheet for Patients: BloggerCourse.com  Fact Sheet for Healthcare Providers: SeriousBroker.it  This test is not yet approved or cleared by the [place] FDA and has been authorized for detection and/or diagnosis of SARS-CoV-2 by FDA under an Emergency Use Authorization (EUA). This EUA will remain in effect (meaning this test can be used) for the duration of the COVID-19 declaration under Section 564(b)(1) of the Act, 21 U.S.C. section 360bbb-3(b)(1), unless the authorization is terminated or revoked.     Resp Syncytial Virus by PCR NEGATIVE NEGATIVE Final    Comment: (NOTE) Fact Sheet for Patients: BloggerCourse.com  Fact Sheet for Healthcare Providers: SeriousBroker.it  This test is not yet approved or cleared by the [place] and  has been authorized for detection and/or diagnosis of SARS-CoV-2 by FDA under an Emergency Use Authorization (EUA). This EUA will remain in effect (meaning this test can be used) for the duration of the COVID-19 declaration under Section 564(b)(1) of the Act, 21 U.S.C. section 360bbb-3(b)(1), unless the authorization is terminated or revoked.  Performed at [facility], [address] [place], [place] [address]   Respiratory (~20 pathogens) panel by PCR     Status: Abnormal   Collection Time: 04/24/23  5:03 PM   Specimen: Nasopharyngeal Swab; Respiratory  Result Value Ref Range Status   Adenovirus NOT DETECTED NOT DETECTED Final   Coronavirus 229E NOT DETECTED NOT DETECTED Final    Comment: (NOTE) The Coronavirus on the Respiratory Panel, DOES NOT test for the novel  Coronavirus (2019 nCoV)    Coronavirus HKU1 NOT DETECTED NOT DETECTED Final   Coronavirus NL63 NOT DETECTED NOT DETECTED Final   Coronavirus OC43 NOT DETECTED NOT DETECTED Final   Metapneumovirus NOT DETECTED NOT DETECTED  Final   Rhinovirus / Enterovirus NOT DETECTED NOT DETECTED Final   Influenza A H1 2009 DETECTED (A) NOT DETECTED Final   Influenza B NOT DETECTED NOT DETECTED Final   Parainfluenza Virus 1 NOT DETECTED NOT DETECTED Final   Parainfluenza Virus 2 NOT DETECTED NOT DETECTED Final   Parainfluenza Virus 3 NOT DETECTED NOT DETECTED Final   Parainfluenza Virus 4 NOT DETECTED NOT DETECTED Final   Respiratory Syncytial Virus NOT DETECTED NOT DETECTED Final   Bordetella pertussis NOT DETECTED NOT DETECTED Final   Bordetella Parapertussis NOT DETECTED NOT DETECTED Final   Chlamydophila pneumoniae NOT DETECTED NOT DETECTED Final   Mycoplasma pneumoniae NOT DETECTED NOT DETECTED Final    Comment: Performed at [facility], [address], [place], [place] [address]     Labs: BNP (last 3 results) No results for input(s): "BNP" in the last 8760 hours. Basic Metabolic Panel: Recent Labs  Lab 04/22/23 1640 04/23/23 0500 04/23/23 0558 04/24/23 0900  NA 129*  --  130* 133*  K 3.0*  --  3.1* 3.4*  CL 93*  --  93* 98  CO2 24  --  24 24  GLUCOSE 101*  --  158* 97  BUN 14  --  16 21  CREATININE 0.74  --  0.87 0.93  CALCIUM 8.3*  --  8.4* 8.3*  MG  --  2.3  --   --    Liver Function Tests: No results for input(s): "AST", "ALT", "ALKPHOS", "BILITOT", "PROT", "ALBUMIN" in the last 168 hours. No results for input(s): "LIPASE", "AMYLASE" in the last 168 hours. No results for input(s): "AMMONIA" in the last 168 hours. CBC: Recent Labs  Lab 04/22/23 1640 04/23/23 0558  WBC 7.3 5.6  HGB 14.8 14.7  HCT 42.2 41.1  MCV 85.6 84.2  PLT 304 219   Cardiac Enzymes: No results for input(s): "CKTOTAL", "CKMB", "CKMBINDEX", "TROPONINI" in the last 168 hours. BNP: Invalid input(s): "POCBNP" CBG: No results for input(s): "GLUCAP" in the last 168 hours. D-Dimer Recent Labs    04/23/23 1700  DDIMER 0.52*   Hgb A1c No results for input(s): "HGBA1C" in the last 72 hours. Lipid Profile No  results for input(s): "CHOL", "HDL", "LDLCALC", "TRIG", "CHOLHDL", "LDLDIRECT" in the last 72 hours. Thyroid function studies No results for input(s): "TSH", "T4TOTAL", "T3FREE", "THYROIDAB" in the last 72 hours.  Invalid input(s): "FREET3" Anemia work up No results for input(s): "VITAMINB12", "FOLATE", "FERRITIN", "TIBC", "IRON", "RETICCTPCT" in the last 72  hours. Urinalysis No results found for: "COLORURINE", "APPEARANCEUR", "LABSPEC", "PHURINE", "GLUCOSEU", "HGBUR", "BILIRUBINUR", "KETONESUR", "PROTEINUR", "UROBILINOGEN", "NITRITE", "LEUKOCYTESUR" Sepsis Labs Recent Labs  Lab 04/22/23 1640 04/23/23 0558  WBC 7.3 5.6   Microbiology Recent Results (from the past 240 hours)  Resp panel by RT-PCR (RSV, Flu A&B, Covid) Anterior Nasal Swab     Status: Abnormal   Collection Time: 04/22/23  4:41 PM   Specimen: Anterior Nasal Swab  Result Value Ref Range Status   SARS Coronavirus 2 by RT PCR NEGATIVE NEGATIVE Final    Comment: (NOTE) SARS-CoV-2 target nucleic acids are NOT DETECTED.  The SARS-CoV-2 RNA is generally detectable in upper respiratory specimens during the acute phase of infection. The lowest concentration of SARS-CoV-2 viral copies this assay can detect is 138 copies/mL. A negative result does not preclude SARS-Cov-2 infection and should not be used as the sole basis for treatment or other patient management decisions. A negative result may occur with  improper specimen collection/handling, submission of specimen other than nasopharyngeal swab, presence of viral mutation(s) within the areas targeted by this assay, and inadequate number of viral copies(<138 copies/mL). A negative result must be combined with clinical observations, patient history, and epidemiological information. The expected result is Negative.  Fact Sheet for Patients:  BloggerCourse.com  Fact Sheet for Healthcare Providers:  SeriousBroker.it  This  test is no t yet approved or cleared by the [place] FDA and  has been authorized for detection and/or diagnosis of SARS-CoV-2 by FDA under an Emergency Use Authorization (EUA). This EUA will remain  in effect (meaning this test can be used) for the duration of the COVID-19 declaration under Section 564(b)(1) of the Act, 21 U.S.C.section 360bbb-3(b)(1), unless the authorization is terminated  or revoked sooner.       Influenza A by PCR POSITIVE (A) NEGATIVE Final   Influenza B by PCR NEGATIVE NEGATIVE Final    Comment: (NOTE) The Xpert Xpress SARS-CoV-2/FLU/RSV plus assay is intended as an aid in the diagnosis of influenza from Nasopharyngeal swab specimens and should not be used as a sole basis for treatment. Nasal washings and aspirates are unacceptable for Xpert Xpress SARS-CoV-2/FLU/RSV testing.  Fact Sheet for Patients: BloggerCourse.com  Fact Sheet for Healthcare Providers: SeriousBroker.it  This test is not yet approved or cleared by the [place] FDA and has been authorized for detection and/or diagnosis of SARS-CoV-2 by FDA under an Emergency Use Authorization (EUA). This EUA will remain in effect (meaning this test can be used) for the duration of the COVID-19 declaration under Section 564(b)(1) of the Act, 21 U.S.C. section 360bbb-3(b)(1), unless the authorization is terminated or revoked.     Resp Syncytial Virus by PCR NEGATIVE NEGATIVE Final    Comment: (NOTE) Fact Sheet for Patients: BloggerCourse.com  Fact Sheet for Healthcare Providers: SeriousBroker.it  This test is not yet approved or cleared by the [place] FDA and has been authorized for detection and/or diagnosis of SARS-CoV-2 by FDA under an Emergency Use Authorization (EUA). This EUA will remain in effect (meaning this test can be used) for the duration of the COVID-19 declaration under  Section 564(b)(1) of the Act, 21 U.S.C. section 360bbb-3(b)(1), unless the authorization is terminated or revoked.  Performed at [facility], [address] [place], [place] [address]   Respiratory (~20 pathogens) panel by PCR     Status: Abnormal   Collection Time: 04/24/23  5:03 PM   Specimen: Nasopharyngeal Swab; Respiratory  Result Value Ref Range Status   Adenovirus NOT  DETECTED NOT DETECTED Final   Coronavirus 229E NOT DETECTED NOT DETECTED Final    Comment: (NOTE) The Coronavirus on the Respiratory Panel, DOES NOT test for the novel  Coronavirus (2019 nCoV)    Coronavirus HKU1 NOT DETECTED NOT DETECTED Final   Coronavirus NL63 NOT DETECTED NOT DETECTED Final   Coronavirus OC43 NOT DETECTED NOT DETECTED Final   Metapneumovirus NOT DETECTED NOT DETECTED Final   Rhinovirus / Enterovirus NOT DETECTED NOT DETECTED Final   Influenza A H1 2009 DETECTED (A) NOT DETECTED Final   Influenza B NOT DETECTED NOT DETECTED Final   Parainfluenza Virus 1 NOT DETECTED NOT DETECTED Final   Parainfluenza Virus 2 NOT DETECTED NOT DETECTED Final   Parainfluenza Virus 3 NOT DETECTED NOT DETECTED Final   Parainfluenza Virus 4 NOT DETECTED NOT DETECTED Final   Respiratory Syncytial Virus NOT DETECTED NOT DETECTED Final   Bordetella pertussis NOT DETECTED NOT DETECTED Final   Bordetella Parapertussis NOT DETECTED NOT DETECTED Final   Chlamydophila pneumoniae NOT DETECTED NOT DETECTED Final   Mycoplasma pneumoniae NOT DETECTED NOT DETECTED Final    Comment: Performed at [facility], [address], [place], [place] [address]     Time coordinating discharge: Over 30 minutes  SIGNED:   Charise Killian, MD  [facility] 04/25/2023, 1:09 PM Pager   If 7PM-7AM, please contact night-coverage [email or web address]

## 2023-04-25 NOTE — TOC Initial Note (Signed)
 Transition of Care [facility]) - Initial/Assessment Note    Patient Details  Name: Louis Barron MRN: [id number] Date of Birth: 03-13-1960  [facility] CM/SW Contact:    Chapman Fitch, RN Phone Number: 04/25/2023, 11:53 AM  Clinical Narrative:                  Notified by MD that anticipated DC for today Patient to discharge with o2 Patent in agreement and states that he does not have a preference of agency.  Referral made to Jon with [employer] for O2   Patient declines the need for home health at discharge      Patient Goals and CMS Choice            Expected Discharge Plan and Services                                              Prior Living Arrangements/Services                       Activities of Daily Living   ADL Screening (condition at time of admission) Independently performs ADLs?: Yes (appropriate for developmental age) Is the patient deaf or have difficulty hearing?: No Does the patient have difficulty seeing, even when wearing glasses/contacts?: No Does the patient have difficulty concentrating, remembering, or making decisions?: No  Permission Sought/Granted                  Emotional Assessment              Admission diagnosis:  Influenza A [J10.1] Acute respiratory failure with hypoxia (HCC) [J96.01] Patient Active Problem List   Diagnosis Date Noted   Obesity (BMI 30-39.9) 04/23/2023   CAP (community acquired pneumonia) 04/23/2023   Influenza A 04/22/2023   Essential hypertension 10/27/2021   PCP:  Marisue Ivan, MD Pharmacy:   [id number] - [place], [place] - [address] AT [facility] [address] [place] [address] Phone: [phone] Fax: [phone]     Social Drivers of Health (SDOH) Social History: SDOH Screenings   Food Insecurity: No Food Insecurity (04/23/2023)  Housing: Low Risk  (04/23/2023)  Transportation Needs: No  Transportation Needs (04/23/2023)  Utilities: Not At Risk (04/23/2023)  Financial Resource Strain: Low Risk  (03/15/2023)   Received from [facility]  Social Connections: Unknown (04/23/2023)  Tobacco Use: Medium Risk (04/23/2023)   SDOH Interventions:     Readmission Risk Interventions     No data to display

## 2023-04-25 NOTE — Progress Notes (Addendum)
 Primary RN walked with patient on room around nursing stations; patient's oxygen saturation was 91% on room air at rest; patient's oxygen saturation dropped to a low of 88% on room air while ambulating; patient did not appear short of breath or in distress. Patient placed on 2 liters of oxygen via nasal canula and oxygen level improved to 94% while ambulating.

## 2023-04-26 LAB — LEGIONELLA PNEUMOPHILA SEROGP 1 UR AG: L. pneumophila Serogp 1 Ur Ag: NEGATIVE
